# Patient Record
Sex: Male | Born: 1940 | Race: Black or African American | Hispanic: No | Marital: Married | Smoking: Current every day smoker
Health system: Southern US, Community
[De-identification: ages and names within clinical notes are randomized; demographics above are authoritative.]

## PROBLEM LIST (undated history)

## (undated) DIAGNOSIS — E785 Hyperlipidemia, unspecified: Secondary | ICD-10-CM

## (undated) DIAGNOSIS — R220 Localized swelling, mass and lump, head: Secondary | ICD-10-CM

## (undated) DIAGNOSIS — I10 Essential (primary) hypertension: Secondary | ICD-10-CM

## (undated) DIAGNOSIS — F209 Schizophrenia, unspecified: Secondary | ICD-10-CM

## (undated) HISTORY — DX: Localized swelling, mass and lump, head: R22.0

## (undated) HISTORY — DX: Schizophrenia, unspecified: F20.9

## (undated) HISTORY — DX: Essential (primary) hypertension: I10

## (undated) HISTORY — DX: Hyperlipidemia, unspecified: E78.5

---

## 2000-03-07 ENCOUNTER — Emergency Department (HOSPITAL_COMMUNITY): Admission: EM | Admit: 2000-03-07 | Discharge: 2000-03-07 | Payer: Self-pay | Admitting: Emergency Medicine

## 2000-04-23 ENCOUNTER — Ambulatory Visit (HOSPITAL_COMMUNITY): Admission: RE | Admit: 2000-04-23 | Discharge: 2000-04-23 | Payer: Self-pay | Admitting: Gastroenterology

## 2008-02-02 ENCOUNTER — Emergency Department (HOSPITAL_COMMUNITY): Admission: EM | Admit: 2008-02-02 | Discharge: 2008-02-03 | Payer: Self-pay | Admitting: Emergency Medicine

## 2008-02-04 ENCOUNTER — Inpatient Hospital Stay (HOSPITAL_COMMUNITY): Admission: EM | Admit: 2008-02-04 | Discharge: 2008-02-06 | Payer: Self-pay | Admitting: Emergency Medicine

## 2008-02-10 ENCOUNTER — Encounter (INDEPENDENT_AMBULATORY_CARE_PROVIDER_SITE_OTHER): Payer: Self-pay | Admitting: Urology

## 2008-02-11 ENCOUNTER — Inpatient Hospital Stay (HOSPITAL_COMMUNITY): Admission: RE | Admit: 2008-02-11 | Discharge: 2008-02-12 | Payer: Self-pay | Admitting: Urology

## 2009-02-17 IMAGING — CT CT ABDOMEN WO/W CM
2 of 12 series · 11 of 46 positions shown, 18 images · IV contrast (agent unspecified)
Comparison: None

 CT ABDOMEN

02/08/08 – DUPLICATE COPY for exam association in [facility] – No change from original report.
CLINICAL DATA: Hematuria

 CT ABDOMEN AND PELVIS WITHOUT AND WITH CONTRAST
TECHNIQUE: Multidetector CT imaging of the abdomen and pelvis was
 performed following the standard protocol before and following the
 bolus administration of intravenous contrast.
 Contrast: 125 ml omni 300

[Series 8: thins @ 2's · axial · 0.69mm/px · z∈[-460,-140]mm · 9 of 196 slices shown, 15 images]
[im 18/196  soft-tissue]
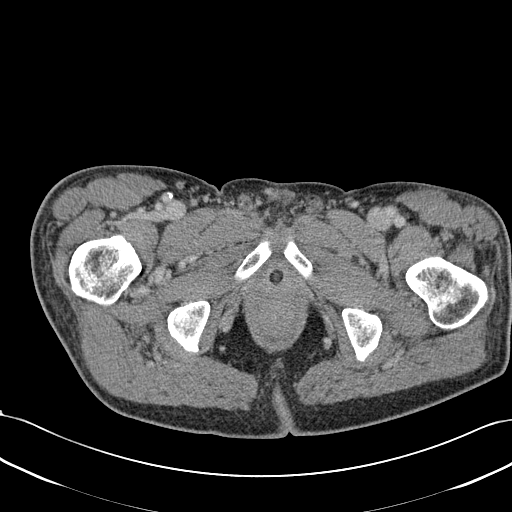
[im 18/196  bone]
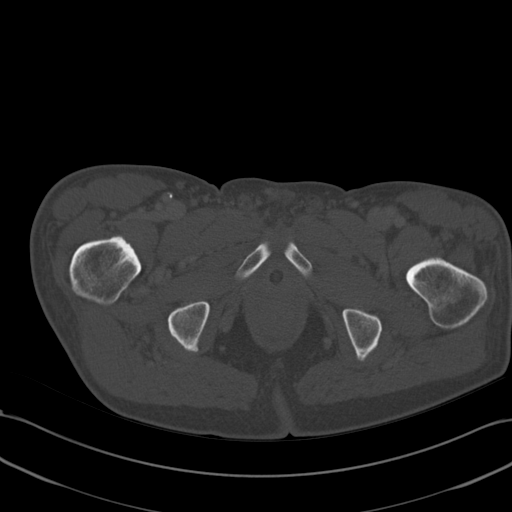
[im 36/196  soft-tissue]
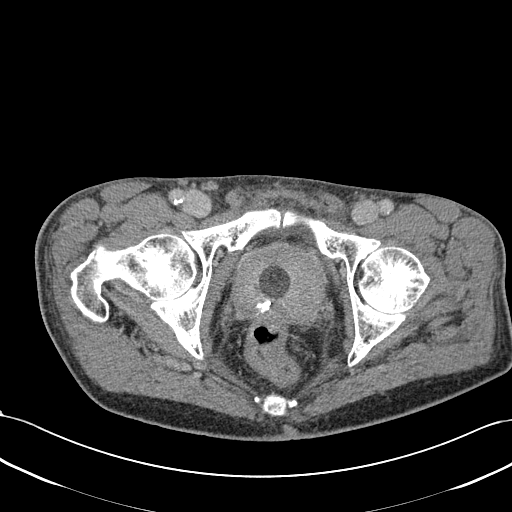
[im 54/196  soft-tissue]
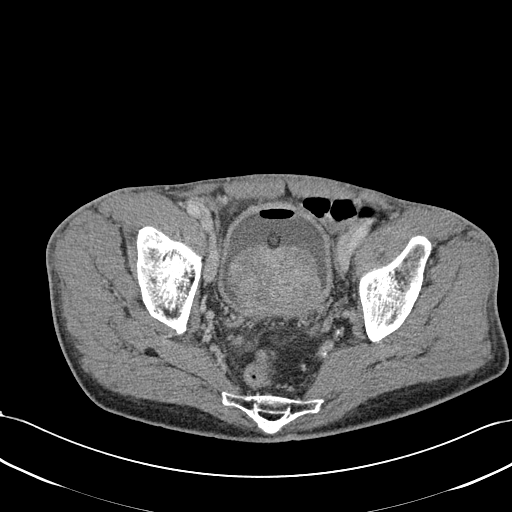
[im 71/196  soft-tissue]
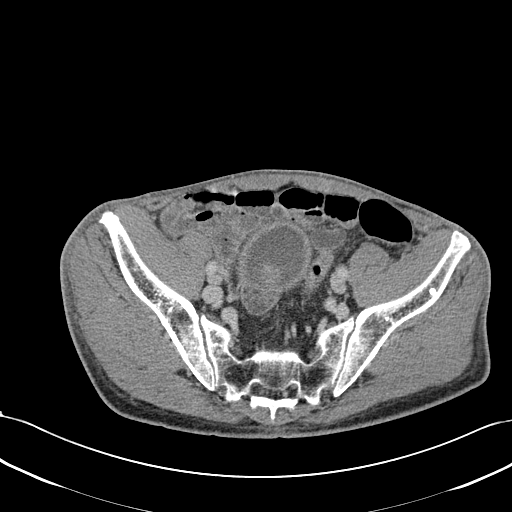
[im 107/196  soft-tissue]
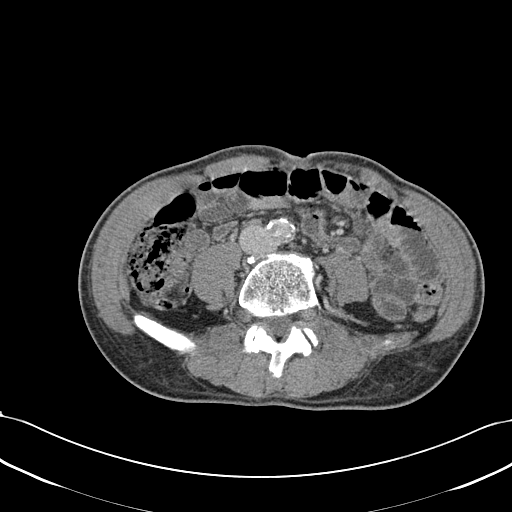
[im 125/196  soft-tissue]
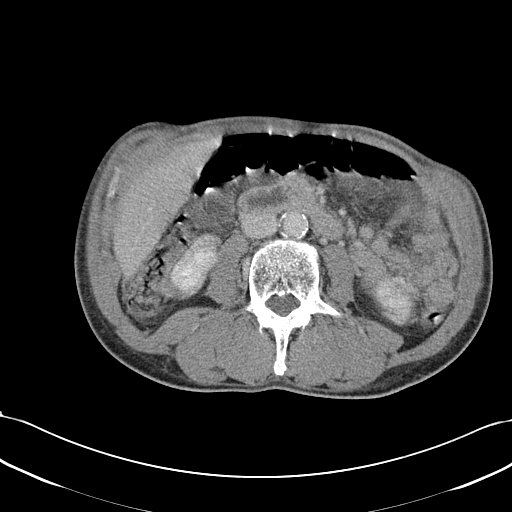
[im 125/196  lung]
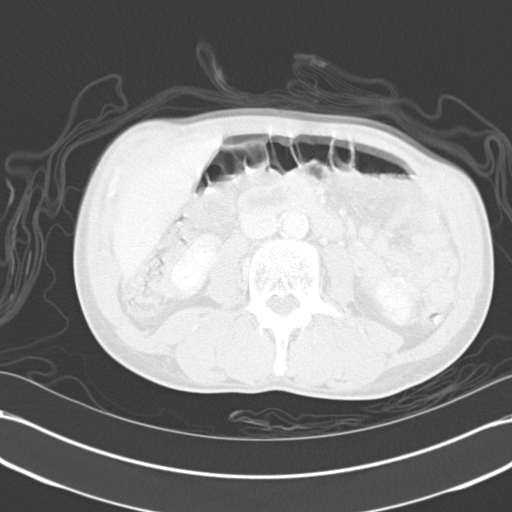
[im 142/196  soft-tissue]
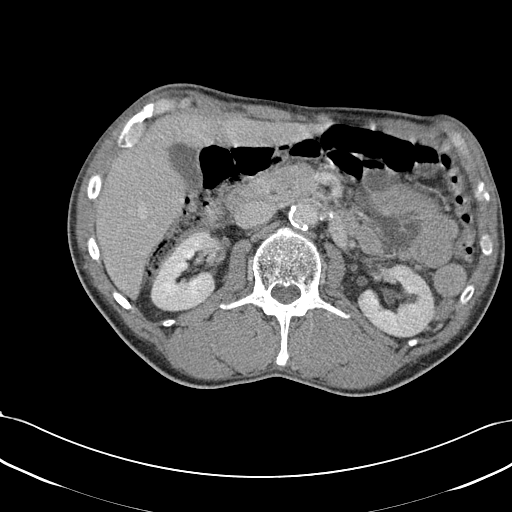
[im 142/196  lung]
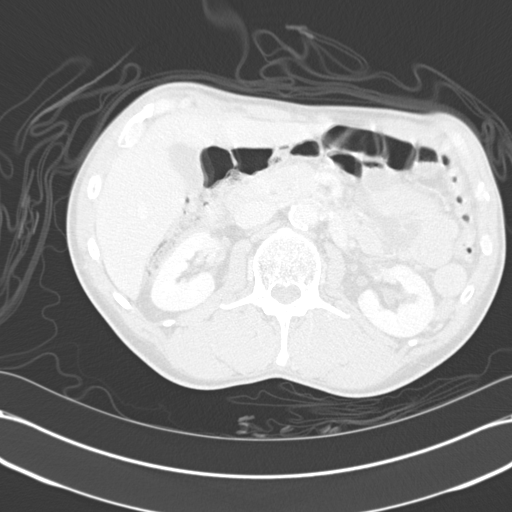
[im 160/196  soft-tissue]
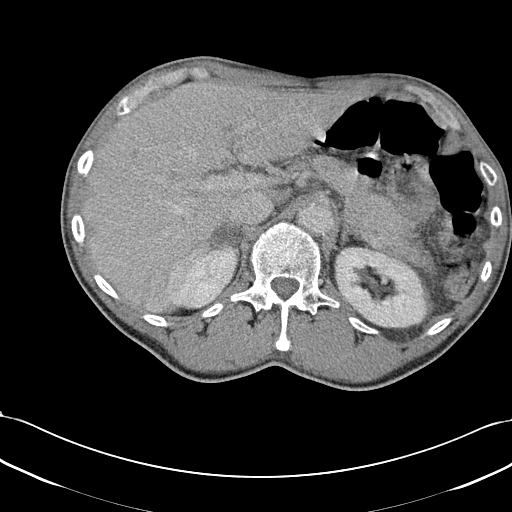
[im 160/196  lung]
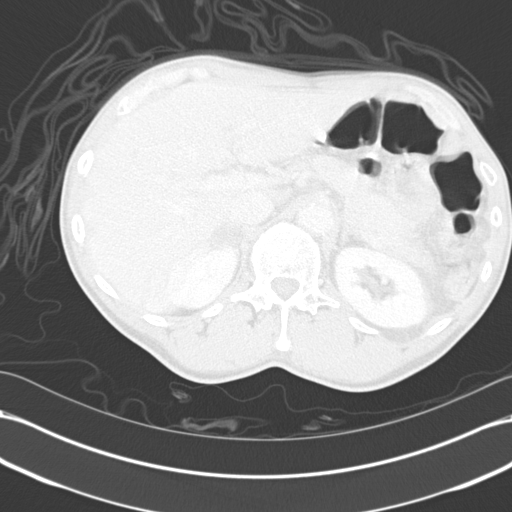
[im 178/196  soft-tissue]
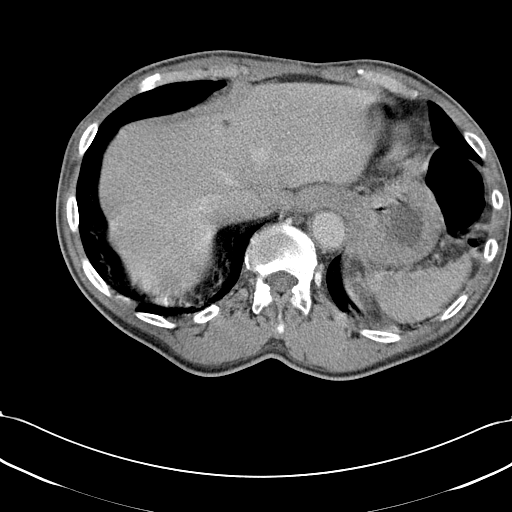
[im 178/196  lung]
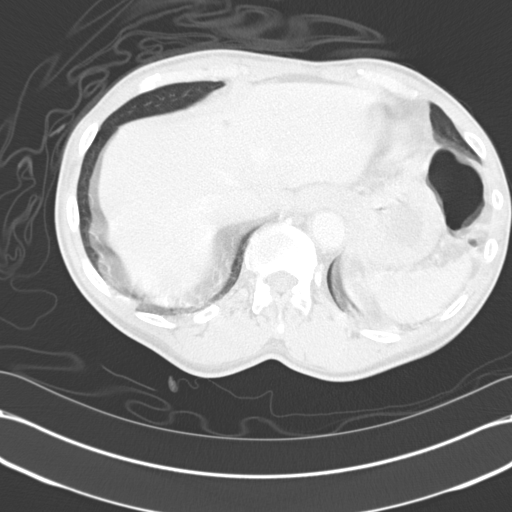
[im 178/196  bone]
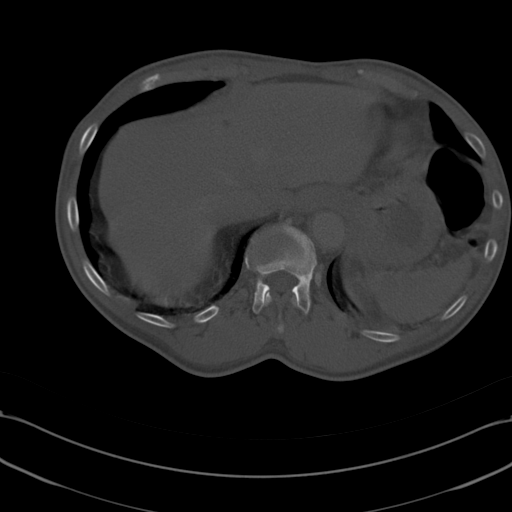

[Series 603: <mpr thick range> · coronal · 0.77mm/px · 2 of 84 slices shown, 3 images]
[im 28/84  soft-tissue]
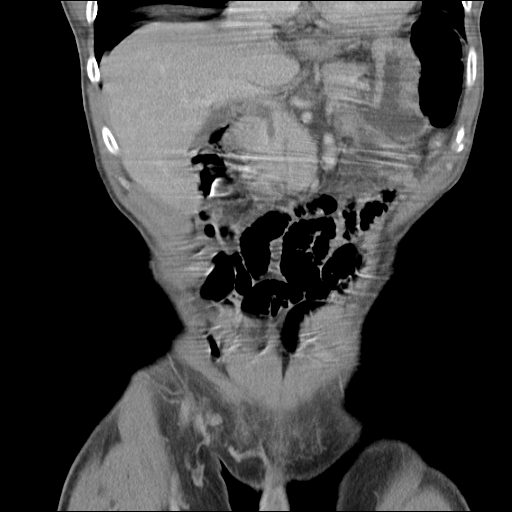
[im 28/84  bone]
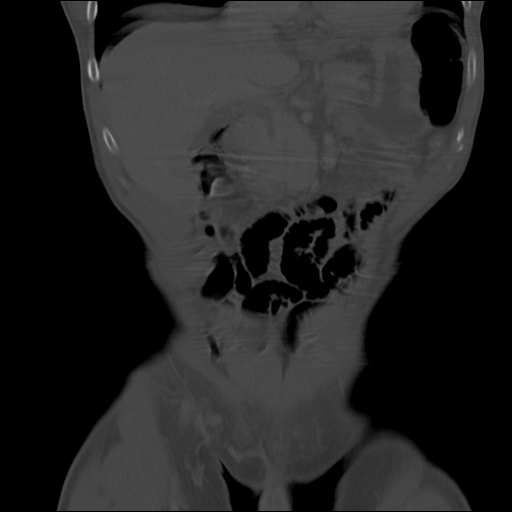
[im 56/84  soft-tissue]
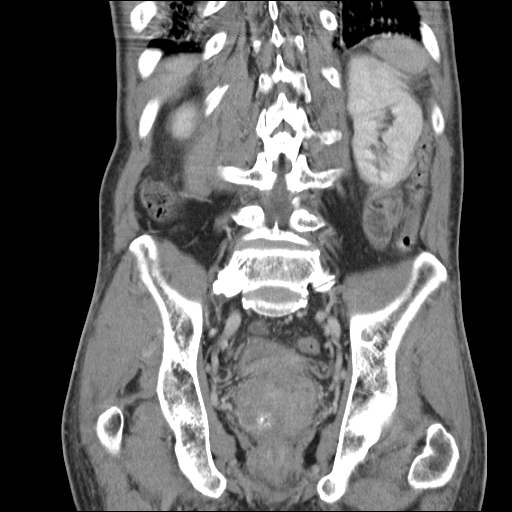

[11 of 46 positions shown; findings below may reference images not displayed]

FINDINGS: Dependent changes are noted at the lung bases. There is
 no pleural or pericardial fluid.

 The spleen is negative.

 There are multiple small hypodensities within both lobes of the
 liver the largest measures 5.3 mm, image 21. These are too small
 to characterize.

 There is no evidence for obstructive uropathy.

 There are multiple small (less than 10 mm) hypodensities in both
 kidneys which are too small to characterize but statistically
 represent simple cyst.

 Low density mass within the left adrenal gland measures 2.1 cm,
 image 26. This likely represents a benign adenoma.

 The left adrenal gland is negative.

 There is no enlarged retroperitoneal or small bowel mesenteric
 lymph nodes.

 No free fluid is identified within the upper abdomen.

 No upper abdominal mass noted.

 Review of the visualized osseous structures shows no suspicious
 lytic or sclerotic lesions.
IMPRESSION: 1. No acute upper abdominal CT findings. No mass or adenopathy.
 2. Low density foci within the liver and kidneys are too small to
 characterize but likely represent simple cyst.

 CT PELVIS
FINDINGS: There is a large mass within the posterior base of the
 urinary bladder. This measures 4.4 x 4.4 cm, image 46. The mass
 is very well circumscribed with smooth margins. This may be
 arising from the prostate gland. A primary bladder neoplasm
 however is not excluded.

 There is a Foley bladder catheter; the balloon is expanded within
 the prostatic portion of the urethra. The tip of the catheter is
 within the lumen of the urinary bladder.

 The prostate gland is markedly enlarged. Tumor involvement of the
 prostate gland cannot be excluded. There is a bilateral
 enlargement of the seminal vesicles. Extension into these areas is
 also not excluded.

 There is no enlarged lymph nodes within the pelvis.

 The pelvic bowel loops are unremarkable.

 There is no free fluid or abnormal fluid collections.

 Review of the visualized osseous structures shows diffuse
 osteopenia.

 No lytic or sclerotic lesions identified.
IMPRESSION: 1. Marked enlargement of the prostate gland and seminal vesicles.
 Prostate carcinoma is not excluded and correlation with biopsy
 advised.
 2. Large mass within the posterior base of the bladder may be
 originating from the prostate gland. Primary bladder neoplasm is
 also not excluded.
 3. Urinary bladder catheter is in place the catheter balloon is
 inflated within the prostate portion of the urethra.

## 2010-02-07 ENCOUNTER — Emergency Department (HOSPITAL_COMMUNITY): Admission: EM | Admit: 2010-02-07 | Discharge: 2010-02-07 | Payer: Self-pay | Admitting: Family Medicine

## 2011-01-14 NOTE — H&P (Signed)
NAME:  William Madden, William Madden NO.:  [id number]   MEDICAL RECORD NO.:  [id number]          PATIENT TYPE:  OBV   LOCATION:  1433                         FACILITY:  [facility]   PHYSICIAN:  Martina Sinner, MD DATE OF BIRTH:  [birth year]   DATE OF ADMISSION:  02/03/2008  DATE OF DISCHARGE:                              HISTORY & PHYSICAL   PRIMARY DIAGNOSIS:  Clot retention and hematuria.   SECONDARY DIAGNOSIS:  1. Acute blood loss anemia.  2. 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     William Madden is a 70 year old gentleman who was in the emergency room  twice with clot retention.  His symptoms were a little bit vague, and he  may have been having a little bit of dysuria and small volume frequency  48 hours prior to this.  He had suprapubic pain from clot retention.  This was getting worse over 6 hours.   When I came to the emergency room he was tender and I have written an  admission note.  There is no question that he is in clot retention.  He  was irrigated to clear and a 26-French three-way catheter with  continuous bladder radiation was hooked up.  His hemoglobin was 12.2  when he was admitted.  He had positive nitrates in his urine.  His white  blood count was 11.5.  His electrolytes were normal.  Serum creatinine  was 0.99.   PAST HISTORY:  Pneumonia.   MEDICATIONS:  He had taken one Cipro tablet.  Otherwise, he takes no  medication.   ALLERGIES:  ASPIRIN.   SOCIAL HISTORY:  Past smoking history.   FAMILY HISTORY:  No prostate or genitourinary malignancies.   REVIEW OF SYSTEMS:  The rest of the review of systems is negative.   PHYSICAL EXAMINATION:  Temperature 98.9, blood pressure 124/81, pulse  76.  CARDIOVASCULAR:  Pulse regular.  Skin is warm.  RESPIRATORY:  Breaths quiet.  Positive ketones on breath.  ABDOMEN:  Palpable bladder.  Tender to touch.  GU exam, normal genitalia.  He had a 60 g, hard to reach, benign-feeling  prostate.  SKIN:   Benign facial mass on right side.  LYMPHATICS:  No inguinal nodes.  MS:  Normal leg and arm motor strength.  NEUROLOGIC:  Normal sensation to touch.   LABS:  Positive nitrates and red blood cells in the urine.  Hemoglobin,  0.2, creatinine 0.99.   William Madden was admitted with CVI and for observation.  He will have a CT  scan and eventually a cystoscopy.  I placed him on ciprofloxacin.  An IV  was also placed.           ______________________________  Martina Sinner, MD  Electronically Signed     SAM/MEDQ  D:  02/04/2008  T:  02/04/2008  Job:  [id number]

## 2011-01-14 NOTE — Discharge Summary (Signed)
NAMEFINN, AMOS NO.:  [id number]   MEDICAL RECORD NO.:  [id number]          PATIENT TYPE:  INP   LOCATION:  1433                         FACILITY:  [facility]   PHYSICIAN:  Martina Sinner, MD DATE OF BIRTH:  [birth year]   DATE OF ADMISSION:  02/03/2008  DATE OF DISCHARGE:  02/06/2008                               DISCHARGE SUMMARY   ADMISSION DIAGNOSES:  1. Clot retention.  2. Acute blood loss anemia.   DISCHARGE DIAGNOSES:  1. Clot retention.  2. Acute blood loss anemia.  3. Bladder tumor.     Mr. Nasire Reali was admitted the hospital with clot retention.  I  irrigated him in the emergency room to clear and set up a continuous  bladder irrigation.  His hemoglobin was 12.1 the day before when he was  seen in the emergency room for the same problem.  He came in with a  Foley catheter not draining well.   While in hospital, his vitals were stable but he dropped his hemoglobin  to 8.1.  He received 2 units of blood and his hemoglobin stabilized.  Three or four hours after I admitted him, he had another episode of  clots that was irrigated to clear.  His urine remained clear for the  rest of the 48-hour admission.   Mr. Daigle hemoglobin stabilized, his vitals were stable.  His belly  was soft and nontender and his catheter was draining well.  He had no  signs of infection, though his urine culture grew E. coli  and he was  treated with ciprofloxacin.  The E. coli was pan sensitive.   His serum creatinine was with was 1.3 and he underwent a CT scan of the  abdomen and pelvis as part of his workup.  His kidneys looked normal.  He had a large mass in the bladder, almost baseball shape, which was  probably bladder cancer.  I did a digital rectal examination.  He had a  large prostate that was difficult to feel.  I did not feel any nodules.   On the day of discharge, Mr. Lundeen had trouble to urinate.  The  catheter was reinserted.  He was sent home  with a catheter.   Mr. Kitagawa was seen by my partner Dr. Heloise Purpura on the eve of his  discharge and Dr. Laverle Patter counseled him and set him up for a cystoscopy  and transurethral section of probable bladder tumor as an outpatient.   Mr. Liew was discharged home with a Foley catheter, with antibiotics  and be following up with Dr. Laverle Patter and as mentioned.   DISCHARGED:  He was discharged home with ciprofloxacin and no other  medication.           ______________________________  Martina Sinner, MD  Electronically Signed     SAM/MEDQ  D:  02/08/2008  T:  02/08/2008  Job:  [id number]

## 2011-01-14 NOTE — Op Note (Signed)
NAMEPELHAM, William Madden NO.:  [id number]   MEDICAL RECORD NO.:  [id number]          PATIENT TYPE:  OIB   LOCATION:  1444                         FACILITY:  [facility]   PHYSICIAN:  Heloise Purpura, MD      DATE OF BIRTH:  09/14/1940   DATE OF PROCEDURE:  02/10/2008  DATE OF DISCHARGE:                               OPERATIVE REPORT   PREOPERATIVE DIAGNOSES:  1. Bladder mass.  2. Hematuria  3. Urinary retention.   POSTOPERATIVE DIAGNOSIS:  1. Benign prostatic hypertrophy.  2. Hematuria.  3. Urinary retention.   PROCEDURES:  1. Cystoscopy.  2. Transurethral resection of the prostate.   SURGEON:  Dr. Heloise Purpura.   ANESTHESIA:  General.   COMPLICATIONS:  None.   SPECIMENS:  Prostate chips.   DISPOSITION:  Specimens to pathology.   INDICATIONS:  Mr. Riling is a 70 year old gentleman who was recently  admitted to the hospital by Dr. Lorin Picket McDiarmid due to gross hematuria  and clot urinary retention.  He was also found to have a urinary tract  infection and underwent treatment with ciprofloxacin.  His hematuria  subsequently cleared, and he was able to be discharged home.  However,  he did fail a voiding trial and required being discharged with his Foley  catheter.  During his hospitalization, he did undergo a CT scan for  further evaluation of his hematuria, which demonstrated a large  circumscribed posterior bladder mass.  It was discussed with him at that  time about proceeding with further cystoscopic evaluation.  Based on the  patient's hematuria and urinary retention, it was felt that even in the  event that this would represent significant hyperplasia of the prostate  that it would be worthwhile to proceed with a definitive treatment.  The  potential risks, complications, and alternative options were discussed  with the patient in detail and informed consent was obtained.   DESCRIPTION OF PROCEDURE:  The patient was taken to the operating room  and a  general anesthetic was administered.  He was given preoperative  antibiotics, placed in the dorsal lithotomy position, and prepped and  draped in the usual sterile fashion.  Next, a preoperative time-out was  performed.  Cystourethroscopy was then performed with the 30 degree  lens, which revealed a normal anterior urethra and a very large  prostate.  While his lateral lobes were certainly enlarged, he had an  immense median lobe.  His ureteral orifices were not readily apparent at  first and indigo-carmine was administered, which did allow  identification of the ureteral orifices.  His median lobe was noted to  be bleeding in multiple areas and extended significantly into the  bladder toward the posterior wall.  There was no evidence for any  bladder tumors or bladder stones.  The cystoscope was then removed and  replaced with a 28-French resectoscope sheath.  Using loupe cautery  resection, the patient's median lobe was then carefully resected in a  systematic fashion with care to provide hemostasis along the way.  The  ureteral orifices were identified throughout the procedure to ensure  their viability.  Once the median lobe had been taken down a significant  amount which was felt to control his hematuria and also to hopefully  improve his voiding, hemostasis was achieved with cautery.  The prostate  chips were removed from the bladder with the Ellik evacuator and a 24-  [place] three-way Silastic hematuria catheter was placed, and the patient  was placed on continuous bladder irrigation.  He was subsequently  transferred to the recovery unit in satisfactory condition after being  awakened from anesthesia.  He tolerated the procedure well and without  complications.      Heloise Purpura, MD  Electronically Signed     LB/MEDQ  D:  02/10/2008  T:  02/10/2008  Job:  [id number]

## 2011-01-17 NOTE — Procedures (Signed)
[facility]  Patient:    HENCE, William Madden                       MRN: [id number] Proc. Date: 04/23/00 Adm. Date:  [id number] Disc. Date: [id number] Attending:  Nelda Marseille CC:         Aura Dials, M.D.   Procedure Report  PROCEDURE:  Esophagogastroduodenoscopy with biopsy.  INDICATIONS:  Abnormal upper GI.  Consent was signed after risks, benefits, methods and options were thoroughly discussed with both the patient and his daughter.  MEDICINES USED:  Demerol 50, Versed 5.  PROCEDURE IN DETAIL:  The video endoscope was inserted by direct vision.  The esophagus was essentially normal.  There were some minimal reflux changes. 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  The pylorus was patent.  Scope passed into the duodenal bulb, which had some minimal bulbitis and around the second portion of the duodenum.  The scope was withdrawn back to the bulb and a good look there ruled out ulcers. The scope was withdrawn back to the stomach and retroflexed.  Just above the angularis on the distal lesser curve a large ulcer was seen and multiple biopsies were obtained and put in the first container.  The cardia and fundus were normal.  The proximal stomach was evaluated on retroflex and then straight visualization.  No additional findings were seen.  The scope was then advanced to the antrum and multiple biopsies in the significant antritis were obtained.  Air was suctioned, scope removed.  Again, a good look at the esophagus showed some minimal reflux changes but no other abnormalities.  The scope was removed.  The patient tolerated the procedure well.  There was no evidence of any complications.  ENDOSCOPIC DIAGNOSES: 1.  Large lesser curve distal ulcer status post biopsy put in container number     one. 2.  Significant  antritis status post biopsy, put in container number two. 3.  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 4.  Otherwise normal EGD except for minimal reflux changes, bulbitis.  PLAN:  Continue Nexium, await pathology.  Follow up in six weeks to recheck symptoms, and if biopsies are okay, reschedule EGD to document healing.  No aspirin or nonsteroidals long-term, Tylenol only.  We will write all that on his discharge dictation. DD:  04/23/00 TD:  04/24/00 Job: [id number]

## 2011-01-17 NOTE — Discharge Summary (Signed)
NAMETHOMSON, HERBERS NO.:  [id number]   MEDICAL RECORD NO.:  [id number]          PATIENT TYPE:  INP   LOCATION:  1444                         FACILITY:  [facility]   PHYSICIAN:  Heloise Purpura, MD      DATE OF BIRTH:  [birth year]   DATE OF ADMISSION:  02/10/2008  DATE OF DISCHARGE:  02/12/2008                               DISCHARGE SUMMARY   ADMISSION DIAGNOSES:  1. Hematuria.  2. Bladder mass.  3. Urinary retention.   DISCHARGE DIAGNOSES:  1. Hematuria.  2. Urinary retention.  3. Benign prostatic hyperplasia.   HISTORY AND PHYSICAL:  For full details, please see admission history  and physical.  Briefly, Mr. Blaustein is a 70 year old gentleman who  presented to the hospital last week with gross hematuria and clot  urinary retention.  He underwent evaluation, including a CT scan which  demonstrated a large bladder mass.  He required blood transfusion and  bladder irrigation to manage his hematuria.  He subsequently was able to  be discharged home.  He was then admitted to the hospital on February 10, 2008, to undergo cystoscopy for further evaluation of his bladder mass  and possible transurethral resection.   HOSPITAL COURSE:  On February 10, 2008, the patient was taken to the  operating room and underwent cystoscopy.  He was noted not to have a  urothelial tumor but rather an extremely large median lobe of the  prostate extending far into his bladder.  He underwent a transurethral  resection of his median lobe to help get him out of urinary retention  and to help control his gross hematuria.  He was subsequently admitted  to the hospital for postoperative care and was maintained on continuous  bladder irrigation.  His irrigation was able to be discontinued on  postoperative day #1, and his catheter was left indwelling.  His  hemoglobin remained stable throughout his hospital course with his  hemoglobin being 8.4 on postoperative day #1.  This was not  significantly  changed from his discharge hemoglobin after his prior  hospital admission.  He was able to void following removal of his  catheter and was able to be discharged home.   DISPOSITION:  Home.   DISCHARGE MEDICATIONS:  He was instructed to use Vicodin as needed for  pain, told to use Colace as a stool softener, and given a prescription  to take Cipro for antibiotic prophylaxis.   DISCHARGE INSTRUCTIONS:  He was instructed to be ambulatory but  specifically told to refrain from any heavy lifting, strenuous activity,  or driving.   FOLLOW UP:  He will follow up in approximately 2-3 weeks with a postvoid  residual urine.      Heloise Purpura, MD  Electronically Signed     LB/MEDQ  D:  02/13/2008  T:  02/14/2008  Job:  [id number]

## 2011-05-29 LAB — CBC
HCT: 23.3 — ABNORMAL LOW
HCT: 24.6 — ABNORMAL LOW
HCT: 27.2 — ABNORMAL LOW
HCT: 36.7 — ABNORMAL LOW
Hemoglobin: 8.4 — ABNORMAL LOW
Hemoglobin: 9 — ABNORMAL LOW
Hemoglobin: 9.3 — ABNORMAL LOW
Hemoglobin: 9.5 — ABNORMAL LOW
MCHC: 33.7
MCHC: 34.1
MCHC: 34.1
MCHC: 34.6
MCV: 94.2
Platelets: 109 — ABNORMAL LOW
Platelets: 211
Platelets: 86 — ABNORMAL LOW
Platelets: 87 — ABNORMAL LOW
Platelets: 89 — ABNORMAL LOW
RBC: 2.56 — ABNORMAL LOW
RBC: 2.76 — ABNORMAL LOW
RDW: 14.6
RDW: 16.8 — ABNORMAL HIGH
RDW: 16.9 — ABNORMAL HIGH
RDW: 17.4 — ABNORMAL HIGH
WBC: 11 — ABNORMAL HIGH
WBC: 8
WBC: 8.9

## 2011-05-29 LAB — BASIC METABOLIC PANEL
BUN: 10
BUN: 16
CO2: 27
CO2: 29
Calcium: 8.1 — ABNORMAL LOW
Calcium: 8.3 — ABNORMAL LOW
Calcium: 8.4
Calcium: 8.5
Chloride: 110
Creatinine, Ser: 1.02
GFR calc Af Amer: >60
GFR calc Af Amer: >60
GFR calc non Af Amer: >60
GFR calc non Af Amer: >60
GFR calc non Af Amer: >60
GFR calc non Af Amer: >60
Glucose, Bld: 100 — ABNORMAL HIGH
Glucose, Bld: 114 — ABNORMAL HIGH
Glucose, Bld: 121 — ABNORMAL HIGH
Glucose, Bld: 99
Potassium: 4.1
Potassium: 4.2
Potassium: 4.3
Sodium: 137
Sodium: 139
Sodium: 139
Sodium: 142

## 2011-05-29 LAB — POCT I-STAT 4, (NA,K, GLUC, HGB,HCT)
Glucose, Bld: 113 — ABNORMAL HIGH
HCT: 28 — ABNORMAL LOW
Operator id: 101021
Potassium: 4.1
Sodium: 136

## 2011-05-29 LAB — ABO/RH
ABO/RH(D): B POS
ABO/RH(D): B POS

## 2011-05-29 LAB — CROSSMATCH
ABO/RH(D): B POS
Antibody Screen: NEGATIVE

## 2011-05-29 LAB — URINALYSIS, ROUTINE W REFLEX MICROSCOPIC
Glucose, UA: NEGATIVE
pH: 7

## 2011-05-29 LAB — URINE CULTURE

## 2011-05-29 LAB — DIFFERENTIAL
Basophils Absolute: 0
Basophils Relative: 0
Eosinophils Absolute: 0
Eosinophils Relative: 0
Lymphocytes Relative: 5 — ABNORMAL LOW

## 2011-05-29 LAB — HEMOGLOBIN AND HEMATOCRIT, BLOOD: HCT: 28.1 — ABNORMAL LOW

## 2012-10-08 LAB — HEPATIC FUNCTION PANEL
ALK PHOS: 42 U/L (ref 25–125)
ALT: 9 U/L — AB (ref 10–40)
AST: 16 U/L (ref 14–40)
Bilirubin, Total: 0.6 mg/dL

## 2012-10-08 LAB — LIPID PANEL
CHOLESTEROL: 175 mg/dL (ref 0–200)
HDL: 38 mg/dL (ref 35–70)
LDL Cholesterol: 125 mg/dL
LDl/HDL Ratio: 4.6
Triglycerides: 61 mg/dL (ref 40–160)

## 2012-10-08 LAB — BASIC METABOLIC PANEL
BUN: 21 mg/dL (ref 4–21)
CREATININE: 1.1 mg/dL (ref 0.6–1.3)
GLUCOSE: 92 mg/dL
POTASSIUM: 4.3 mmol/L (ref 3.4–5.3)
Sodium: 142 mmol/L (ref 137–147)

## 2012-10-25 ENCOUNTER — Other Ambulatory Visit: Payer: Self-pay | Admitting: Family Medicine

## 2012-10-25 DIAGNOSIS — R0989 Other specified symptoms and signs involving the circulatory and respiratory systems: Secondary | ICD-10-CM

## 2012-10-26 ENCOUNTER — Ambulatory Visit
Admission: RE | Admit: 2012-10-26 | Discharge: 2012-10-26 | Disposition: A | Discharge status: Home / Self Care | Payer: Medicare Other | Source: Ambulatory Visit | Attending: Family Medicine | Admitting: Family Medicine

## 2012-10-26 DIAGNOSIS — R0989 Other specified symptoms and signs involving the circulatory and respiratory systems: Secondary | ICD-10-CM

## 2013-10-17 ENCOUNTER — Encounter: Payer: Self-pay | Admitting: Family Medicine

## 2013-10-17 ENCOUNTER — Telehealth: Payer: Self-pay | Admitting: Family Medicine

## 2013-10-17 DIAGNOSIS — E785 Hyperlipidemia, unspecified: Secondary | ICD-10-CM | POA: Insufficient documentation

## 2013-10-17 DIAGNOSIS — I10 Essential (primary) hypertension: Secondary | ICD-10-CM | POA: Insufficient documentation

## 2013-10-17 MED ORDER — PRAVASTATIN SODIUM 80 MG PO TABS: 80.0000 mg | ORAL_TABLET | Freq: Every day | ORAL | Status: DC

## 2013-10-17 MED ORDER — LISINOPRIL-HYDROCHLOROTHIAZIDE 20-12.5 MG PO TABS: 1.0000 | ORAL_TABLET | Freq: Every day | ORAL | Status: DC

## 2013-10-17 NOTE — Telephone Encounter (Signed)
Medication refill for one time only.  Patient needs to be seen.  Letter sent for patient to call and schedule 

## 2013-11-04 ENCOUNTER — Other Ambulatory Visit: Payer: Medicare Other

## 2013-11-04 DIAGNOSIS — Z125 Encounter for screening for malignant neoplasm of prostate: Secondary | ICD-10-CM

## 2013-11-04 DIAGNOSIS — I10 Essential (primary) hypertension: Secondary | ICD-10-CM

## 2013-11-04 DIAGNOSIS — Z79899 Other long term (current) drug therapy: Secondary | ICD-10-CM

## 2013-11-04 DIAGNOSIS — E782 Mixed hyperlipidemia: Secondary | ICD-10-CM

## 2013-11-04 LAB — CBC WITH DIFFERENTIAL/PLATELET
BASOS ABS: 0 10*3/uL (ref 0.0–0.1)
BASOS PCT: 0 % (ref 0–1)
EOS ABS: 0.1 10*3/uL (ref 0.0–0.7)
EOS PCT: 2 % (ref 0–5)
HCT: 37 % — ABNORMAL LOW (ref 39.0–52.0)
Hemoglobin: 12.2 g/dL — ABNORMAL LOW (ref 13.0–17.0)
LYMPHS PCT: 43 % (ref 12–46)
Lymphs Abs: 2.5 10*3/uL (ref 0.7–4.0)
MCH: 32.4 pg (ref 26.0–34.0)
MCHC: 33 g/dL (ref 30.0–36.0)
MCV: 98.4 fL (ref 78.0–100.0)
Monocytes Absolute: 0.5 10*3/uL (ref 0.1–1.0)
Monocytes Relative: 8 % (ref 3–12)
Neutro Abs: 2.7 10*3/uL (ref 1.7–7.7)
Neutrophils Relative %: 47 % (ref 43–77)
PLATELETS: 147 10*3/uL — AB (ref 150–400)
RBC: 3.76 MIL/uL — ABNORMAL LOW (ref 4.22–5.81)
RDW: 14 % (ref 11.5–15.5)
WBC: 5.8 10*3/uL (ref 4.0–10.5)

## 2013-11-04 LAB — COMPREHENSIVE METABOLIC PANEL
ALBUMIN: 3.9 g/dL (ref 3.5–5.2)
ALT: 9 U/L (ref 0–53)
AST: 13 U/L (ref 0–37)
Alkaline Phosphatase: 42 U/L (ref 39–117)
BUN: 19 mg/dL (ref 6–23)
CALCIUM: 9 mg/dL (ref 8.4–10.5)
CHLORIDE: 106 meq/L (ref 96–112)
CO2: 23 meq/L (ref 19–32)
Creat: 1.22 mg/dL (ref 0.50–1.35)
Glucose, Bld: 93 mg/dL (ref 70–99)
POTASSIUM: 4.4 meq/L (ref 3.5–5.3)
SODIUM: 141 meq/L (ref 135–145)
TOTAL PROTEIN: 6.4 g/dL (ref 6.0–8.3)
Total Bilirubin: 0.6 mg/dL (ref 0.2–1.2)

## 2013-11-04 LAB — PSA, MEDICARE: PSA: 3.51 ng/mL (ref <=4.00)

## 2013-11-04 LAB — LIPID PANEL
Cholesterol: 184 mg/dL (ref 0–200)
HDL: 35 mg/dL — AB (ref >39)
LDL Cholesterol: 136 mg/dL — ABNORMAL HIGH (ref 0–99)
Total CHOL/HDL Ratio: 5.3 Ratio
Triglycerides: 64 mg/dL (ref <150)
VLDL: 13 mg/dL (ref 0–40)

## 2013-11-07 ENCOUNTER — Other Ambulatory Visit: Payer: Self-pay | Admitting: Family Medicine

## 2013-11-07 DIAGNOSIS — Z1211 Encounter for screening for malignant neoplasm of colon: Secondary | ICD-10-CM

## 2013-11-08 ENCOUNTER — Ambulatory Visit (INDEPENDENT_AMBULATORY_CARE_PROVIDER_SITE_OTHER): Payer: Medicare Other | Admitting: Family Medicine

## 2013-11-08 ENCOUNTER — Encounter: Payer: Self-pay | Admitting: Family Medicine

## 2013-11-08 VITALS — BP 140/80 | HR 76 | Temp 97.6°F | Resp 24 | Ht 71.0 in | Wt 137.0 lb

## 2013-11-08 DIAGNOSIS — F200 Paranoid schizophrenia: Secondary | ICD-10-CM

## 2013-11-08 DIAGNOSIS — R220 Localized swelling, mass and lump, head: Secondary | ICD-10-CM | POA: Insufficient documentation

## 2013-11-08 DIAGNOSIS — Z0000 Encounter for general adult medical examination without abnormal findings: Secondary | ICD-10-CM

## 2013-11-08 DIAGNOSIS — F209 Schizophrenia, unspecified: Secondary | ICD-10-CM | POA: Insufficient documentation

## 2013-11-08 MED ORDER — LISINOPRIL-HYDROCHLOROTHIAZIDE 20-12.5 MG PO TABS: 1.0000 | ORAL_TABLET | Freq: Every day | ORAL | Status: DC

## 2013-11-08 MED ORDER — QUETIAPINE FUMARATE 25 MG PO TABS: 25.0000 mg | ORAL_TABLET | Freq: Two times a day (BID) | ORAL | Status: DC

## 2013-11-08 MED ORDER — ATORVASTATIN CALCIUM 80 MG PO TABS: 80.0000 mg | ORAL_TABLET | Freq: Every day | ORAL | Status: DC

## 2013-11-08 NOTE — Progress Notes (Signed)
 Subjective:    Patient ID: William Madden, male    DOB: 10/04/1940, 72 y.o.   MRN: [id number]  HPI Patient is here today with his wife for complete physical exam. He has no desire to be here. He has a history of undefined no illness. I believe the patient has schizophrenia with paranoid delusions. He is previously been admitted to [facility] and was treated with antipsychotics over 40 years ago. He has not had any psychiatrist since and he uses all psychiatric consultation. He refuses a colonoscopy. He refuses a prostate exam. His most recent labwork as listed below: Lab on 11/04/2013  Component Date Value Ref Range Status  . WBC 11/04/2013 5.8  4.0 - 10.5 K/uL Final  . RBC 11/04/2013 3.76* 4.22 - 5.81 MIL/uL Final  . Hemoglobin 11/04/2013 12.2* 13.0 - 17.0 g/dL Final  . HCT 11/04/2013 37.0* 39.0 - 52.0 % Final  . MCV 11/04/2013 98.4  78.0 - 100.0 fL Final  . MCH 11/04/2013 32.4  26.0 - 34.0 pg Final  . MCHC 11/04/2013 33.0  30.0 - 36.0 g/dL Final  . RDW 11/04/2013 14.0  11.5 - 15.5 % Final  . Platelets 11/04/2013 147* 150 - 400 K/uL Final  . Neutrophils Relative % 11/04/2013 47  43 - 77 % Final  . Neutro Abs 11/04/2013 2.7  1.7 - 7.7 K/uL Final  . Lymphocytes Relative 11/04/2013 43  12 - 46 % Final  . Lymphs Abs 11/04/2013 2.5  0.7 - 4.0 K/uL Final  . Monocytes Relative 11/04/2013 8  3 - 12 % Final  . Monocytes Absolute 11/04/2013 0.5  0.1 - 1.0 K/uL Final  . Eosinophils Relative 11/04/2013 2  0 - 5 % Final  . Eosinophils Absolute 11/04/2013 0.1  0.0 - 0.7 K/uL Final  . Basophils Relative 11/04/2013 0  0 - 1 % Final  . Basophils Absolute 11/04/2013 0.0  0.0 - 0.1 K/uL Final  . Smear Review 11/04/2013 Criteria for review not met   Final  . Cholesterol 11/04/2013 184  0 - 200 mg/dL Final   Comment: ATP III Classification:                                < 200        mg/dL        Desirable                               200 - 239     mg/dL        Borderline High   >= 240        mg/dL        High                             . Triglycerides 11/04/2013 64  <150 mg/dL Final  . HDL 11/04/2013 35* >39 mg/dL Final  . Total CHOL/HDL Ratio 11/04/2013 5.3   Final  . VLDL 11/04/2013 13  0 - 40 mg/dL Final  . LDL Cholesterol 11/04/2013 136* 0 - 99 mg/dL Final   Comment:                            Total Cholesterol/HDL Ratio:CHD Risk                                                   Coronary Heart Disease Risk Table                                                                 Men       Women                                   1/2 Average Risk              3.4        3.3                                       Average Risk              5.0        4.4                                    2X Average Risk              9.6        7.1                                    3X Average Risk             23.4       11.0                          Use the calculated Patient Ratio above and the CHD Risk table                           to determine the patient's CHD Risk.                          ATP III Classification (LDL):                                < 100        mg/dL         Optimal                               100 - 129     mg/dL         Near or Above Optimal                               130 - 159     mg/dL         Borderline High                               160 - 189     mg/dL  High                                > 190        mg/dL         Very High                             . PSA 11/04/2013 3.51  <=4.00 ng/mL Final   Comment: Test Methodology: ECLIA PSA (Electrochemiluminescence Immunoassay)                                                     For PSA values from 2.5-4.0, particularly in younger men <60 years                          old, the AUA and NCCN suggest testing for % Free PSA (3515) and                          evaluation of the rate of increase in PSA (PSA velocity).  . Sodium 11/04/2013 141  135 - 145 mEq/L Final  . Potassium 11/04/2013 4.4  3.5  - 5.3 mEq/L Final  . Chloride 11/04/2013 106  96 - 112 mEq/L Final  . CO2 11/04/2013 23  19 - 32 mEq/L Final  . Glucose, Bld 11/04/2013 93  70 - 99 mg/dL Final  . BUN 11/04/2013 19  6 - 23 mg/dL Final  . Creat 11/04/2013 1.22  0.50 - 1.35 mg/dL Final  . Total Bilirubin 11/04/2013 0.6  0.2 - 1.2 mg/dL Final  . Alkaline Phosphatase 11/04/2013 42  39 - 117 U/L Final  . AST 11/04/2013 13  0 - 37 U/L Final  . ALT 11/04/2013 9  0 - 53 U/L Final  . Total Protein 11/04/2013 6.4  6.0 - 8.3 g/dL Final  . Albumin 11/04/2013 3.9  3.5 - 5.2 g/dL Final  . Calcium 11/04/2013 9.0  8.4 - 10.5 mg/dL Final  Abstract on 10/17/2013  Component Date Value Ref Range Status  . Glucose 10/08/2012 92   Final  . BUN 10/08/2012 21  4 - 21 mg/dL Final  . Creatinine 10/08/2012 1.1  0.6 - 1.3 mg/dL Final  . Potassium 10/08/2012 4.3  3.4 - 5.3 mmol/L Final  . Sodium 10/08/2012 142  137 - 147 mmol/L Final  . LDl/HDL Ratio 10/08/2012 4.6   Final  . Triglycerides 10/08/2012 61  40 - 160 mg/dL Final  . Cholesterol 10/08/2012 175  0 - 200 mg/dL Final  . HDL 10/08/2012 38  35 - 70 mg/dL Final  . LDL Cholesterol 10/08/2012 125   Final  . Alkaline Phosphatase 10/08/2012 42  25 - 125 U/L Final  . ALT 10/08/2012 9* 10 - 40 U/L Final  . AST 10/08/2012 16  14 - 40 U/L Final  . Bilirubin, Total 10/08/2012 0.6   Final   Past Medical History  Diagnosis Date  . Hyperlipidemia   . Hypertension   . Schizophrenia   . Swelling, mass, or lump on face    Current Outpatient Prescriptions on File Prior to Visit  Medication Sig Dispense Refill  . pravastatin (PRAVACHOL) 80 MG tablet Take   1 tablet (80 mg total) by mouth daily.  30 tablet  0   No current facility-administered medications on file prior to visit.   Allergies  Allergen Reactions  . Asa [Aspirin]    History   Social History  . Marital Status: Married    Spouse Name: N/A    Number of Children: N/A  . Years of Education: N/A   Occupational History  . Not on  file.   Social History Main Topics  . Smoking status: Current Every Day Smoker    Types: Cigarettes  . Smokeless tobacco: Not on file  . Alcohol Use: No  . Drug Use: No  . Sexual Activity: Yes     Comment: married   Other Topics Concern  . Not on file   Social History Narrative  . No narrative on file   Family History  Problem Relation Age of Onset  . Cancer Father     prostate       Review of Systems  All other systems reviewed and are negative.       Objective:   Physical Exam  Vitals reviewed. Constitutional: He is oriented to person, place, and time. He appears well-developed and well-nourished. No distress.  HENT:  Head: Normocephalic and atraumatic.  Right Ear: External ear normal.  Left Ear: External ear normal.  Nose: Nose normal.  Mouth/Throat: Oropharynx is clear and moist. No oropharyngeal exudate.  Eyes: Conjunctivae and EOM are normal. Pupils are equal, round, and reactive to light. Right eye exhibits no discharge. Left eye exhibits no discharge. No scleral icterus.  Neck: Neck supple. No JVD present. No tracheal deviation present. No thyromegaly present.  Cardiovascular: Normal rate, regular rhythm, normal heart sounds and intact distal pulses.  Exam reveals no gallop and no friction rub.   No murmur heard. Pulmonary/Chest: Effort normal. He has wheezes. He has no rales. He exhibits no tenderness.  Abdominal: Soft. Bowel sounds are normal. He exhibits no distension and no mass. There is no tenderness. There is no rebound and no guarding.  Musculoskeletal: Normal range of motion. He exhibits no edema and no tenderness.  Neurological: He is alert and oriented to person, place, and time. He has normal reflexes. He displays normal reflexes. No cranial nerve deficit. He exhibits normal muscle tone. Coordination normal.  Skin: Skin is warm. No rash noted. He is not diaphoretic. No erythema. No pallor.  Psychiatric: His affect is angry. His speech is  tangential. He is agitated, aggressive and combative. Thought content is paranoid and delusional. Cognition and memory are normal. He expresses inappropriate judgment.          Assessment & Plan:  Paranoid schizophrenia - Plan: QUEtiapine (SEROQUEL) 25 MG tablet  Routine general medical examination at a health care facility   Patient is here for a physical exam but he refuses to allow me to do a prostate examination.  He refuses a colonoscopy or any stool screening cards. He also refuses shingles vaccine, flu shot, tetanus shot, and a pneumonia vaccine. Although not physically aggressive he is very verbally combative and aggressive and does not want any kind of interventions performed. His blood pressure is acceptably controlled with his current medication. I will discontinue pravastatin and switch the patient to Lipitor 80 mg a day to try to decrease his LDL cholesterol below 100. I have recommended smoking cessation but the patient refuses. I recommended ENT evaluation and surgical excision of the large mass on his right face. He refuses any evaluation of  this mass and becomes very agitated and angry when discussed with him. I believe the patient has paranoid schizophrenia. I recommended starting the patient onset 25 mg by mouth twice a day and follow up in one month.

## 2013-11-09 IMAGING — US US CAROTID DUPLEX BILAT
1 series · 13 of 24 positions shown · non-contrast
Comparison: None.

CLINICAL DATA: Asymptomatic right-sided carotid bruit, history
smoking

BILATERAL CAROTID DUPLEX ULTRASOUND
TECHNIQUE: Gray scale imaging, color Doppler and duplex ultrasound
was performed of bilateral carotid and vertebral arteries in the
neck.

[Series 1: us carotid duplex bilat · 0.08mm/px · 13 of 59 slices shown]
[im 1/59]
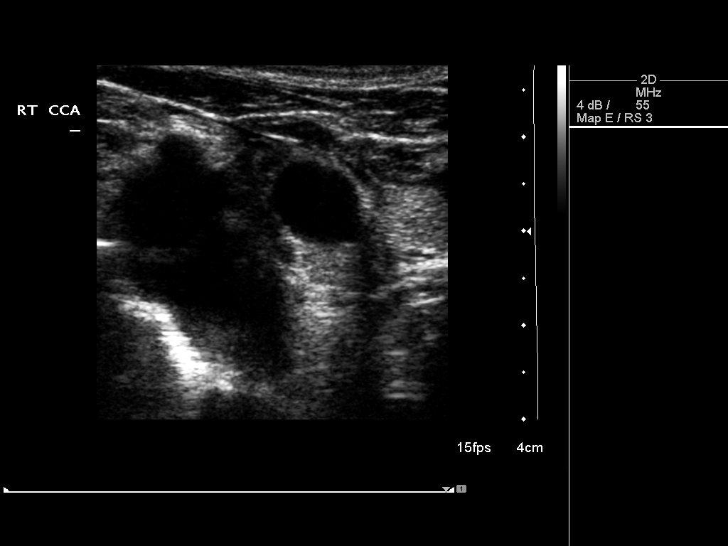
[im 6/59]
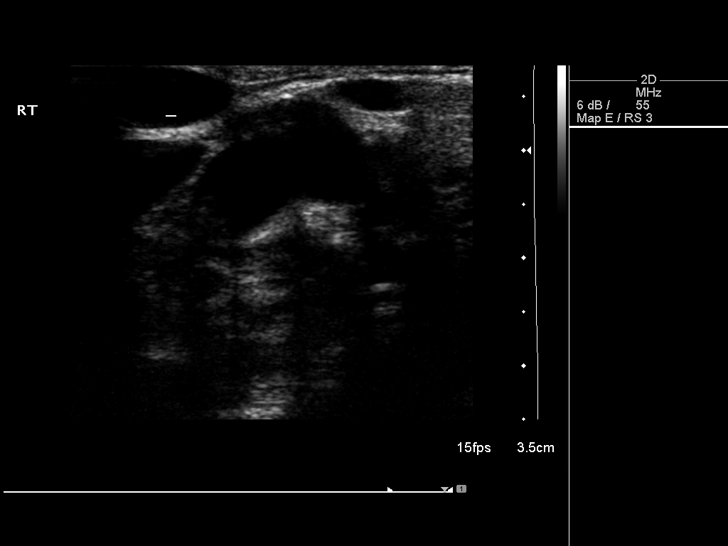
[im 11/59]
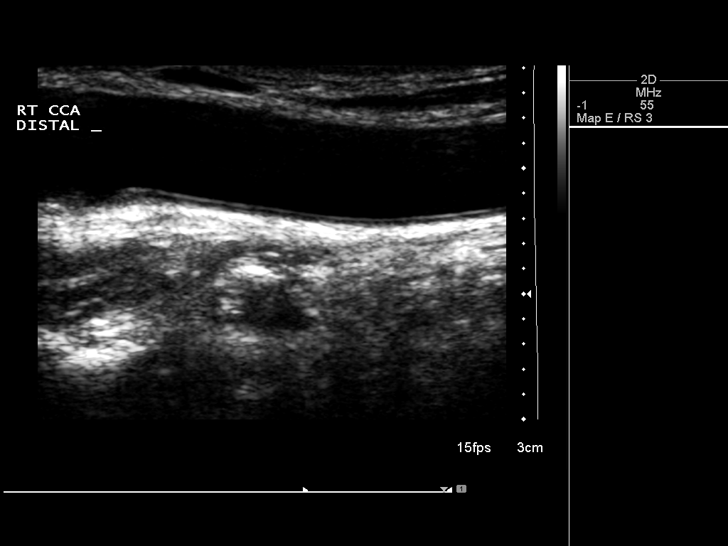
[im 16/59]
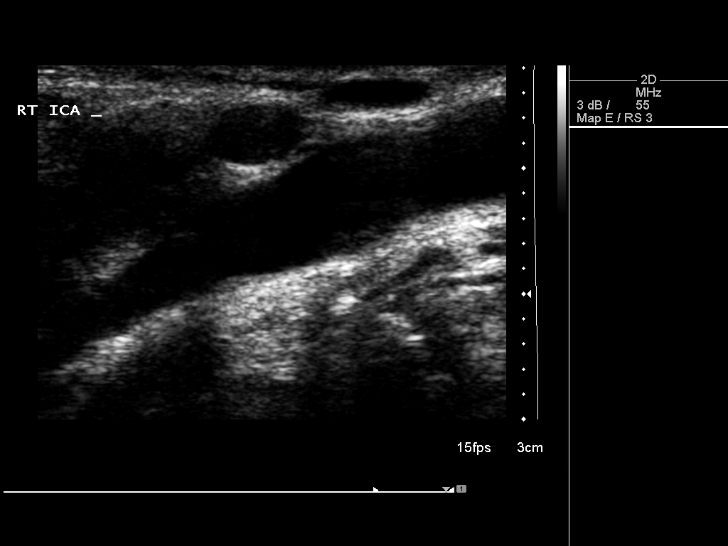
[im 21/59]
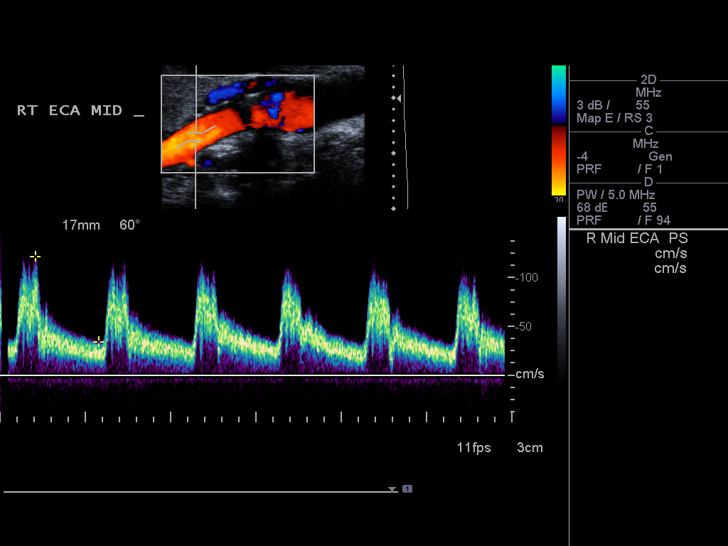
[im 26/59]
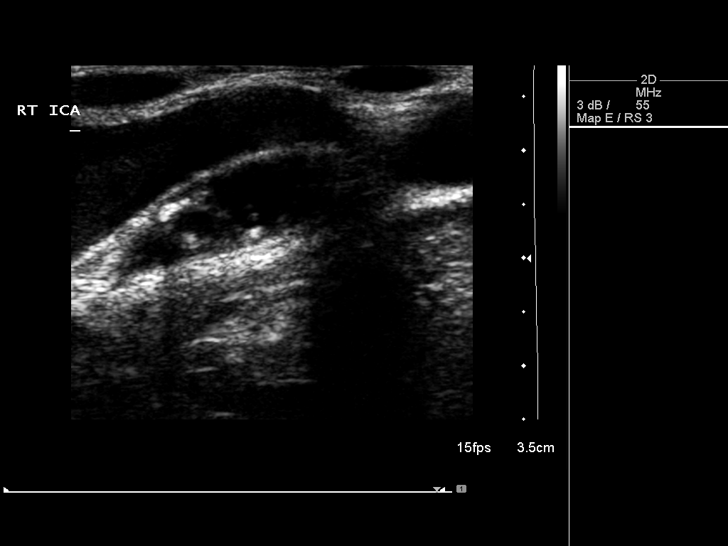
[im 31/59]
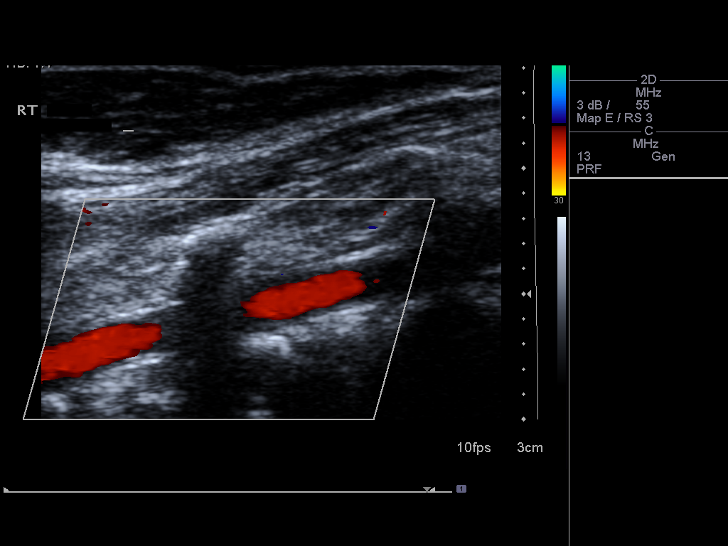
[im 33/59]
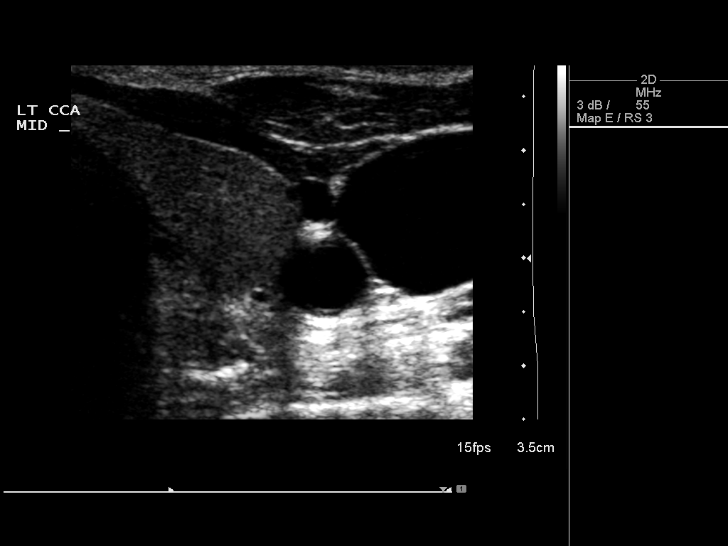
[im 38/59]
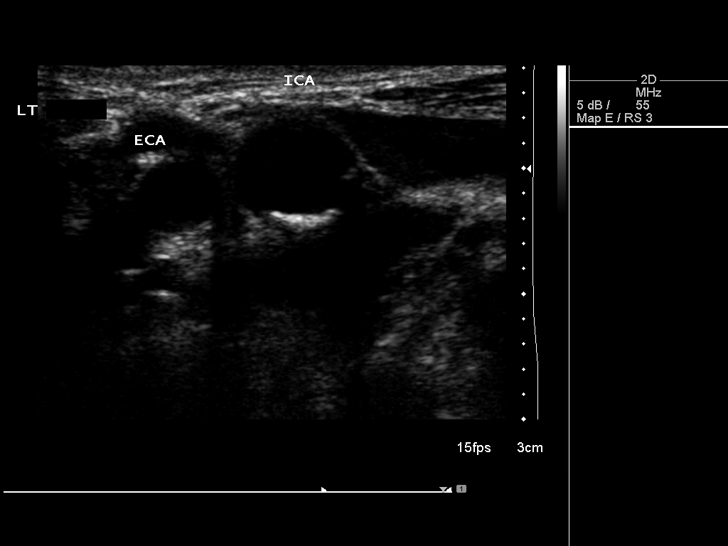
[im 43/59]
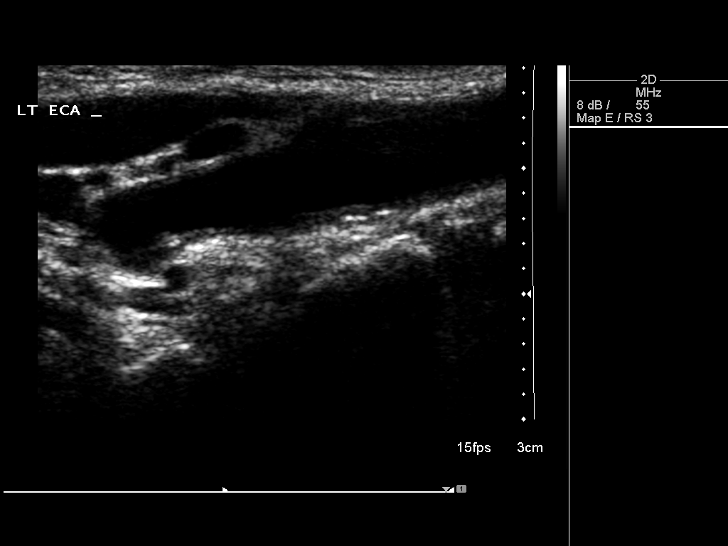
[im 48/59]
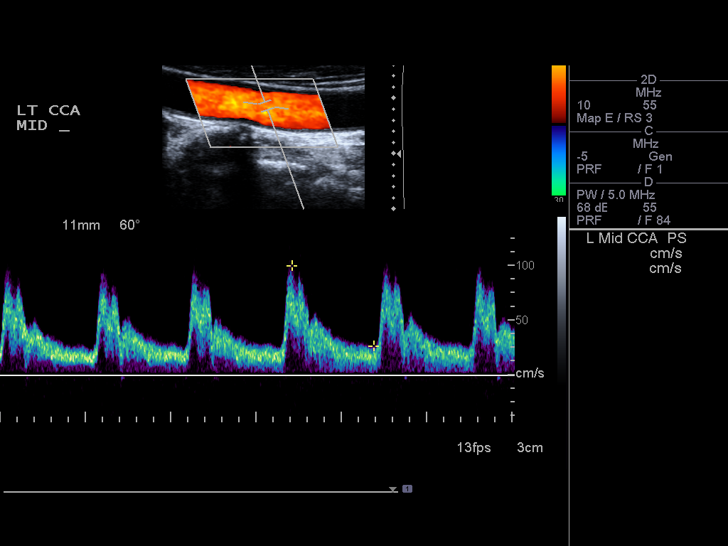
[im 53/59]
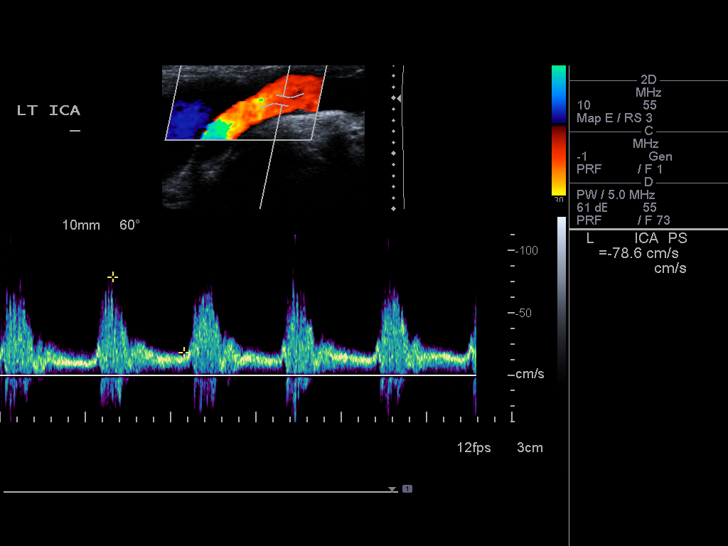
[im 59/59]
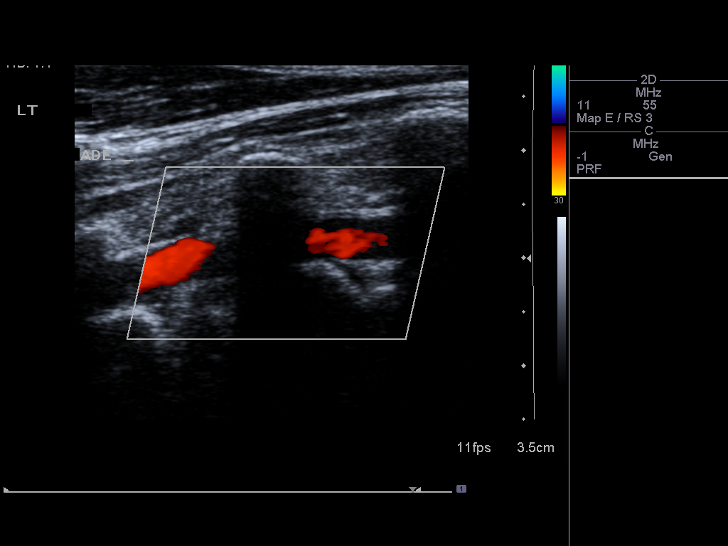

[13 of 24 positions shown; findings below may reference images not displayed]

Criteria:  Quantification of carotid stenosis is based on velocity
parameters that correlate the residual internal carotid diameter
with NASCET-based stenosis levels, using the diameter of the distal
internal carotid lumen as the denominator for stenosis measurement.

The following velocity measurements were obtained:

                 PEAK SYSTOLIC/END DIASTOLIC
RIGHT
ICA:                        94/15cm/sec
CCA:                        118/29cm/sec
SYSTOLIC ICA/CCA RATIO:
DIASTOLIC ICA/CCA RATIO:
ECA:                        91cm/sec

LEFT
ICA:                        91/33cm/sec
CCA:                        115/35cm/sec
SYSTOLIC ICA/CCA RATIO:
DIASTOLIC ICA/CCA RATIO:
ECA:                        110cm/sec
FINDINGS: RIGHT CAROTID ARTERY: There is a minimal amount of eccentric
intimal wall thickening and mixed echogenic plaque within the
origin and proximal aspect of the right internal carotid artery
(images 26 through 28) which does not result in elevated peak
systolic velocities within the right internal carotid artery to
suggest hemodynamically significant stenosis.

RIGHT VERTEBRAL ARTERY:  Antegrade flow

LEFT CAROTID ARTERY: There is circumferential intimal wall
thickening within the left carotid bulb (image 37) with an
eccentric focus of echogenic partially shadowing plaque involving
the origin and proximal aspect of the left internal carotid artery
(image 47), not resulting elevated peak systolic velocities within
the left internal carotid artery to suggest hemodynamically
significant stenosis.

LEFT VERTEBRAL ARTERY:  Antegrade flow
IMPRESSION: Minimal amount of bilateral atherosclerotic plaque, right
subjectively greater than left, not resulting in hemodynamically
significant stenosis.

## 2013-12-09 ENCOUNTER — Encounter: Payer: Self-pay | Admitting: Family Medicine

## 2014-08-13 ENCOUNTER — Other Ambulatory Visit: Payer: Self-pay | Admitting: Family Medicine

## 2014-11-07 ENCOUNTER — Other Ambulatory Visit: Payer: Self-pay | Admitting: Family Medicine

## 2015-01-25 ENCOUNTER — Encounter: Payer: Self-pay | Admitting: Family Medicine

## 2015-01-25 ENCOUNTER — Ambulatory Visit (INDEPENDENT_AMBULATORY_CARE_PROVIDER_SITE_OTHER): Payer: Medicare Other | Admitting: Family Medicine

## 2015-01-25 VITALS — BP 126/74 | HR 68 | Temp 97.8°F | Resp 14 | Ht 71.0 in | Wt 136.0 lb

## 2015-01-25 DIAGNOSIS — Z1211 Encounter for screening for malignant neoplasm of colon: Secondary | ICD-10-CM | POA: Diagnosis not present

## 2015-01-25 DIAGNOSIS — Z0000 Encounter for general adult medical examination without abnormal findings: Secondary | ICD-10-CM

## 2015-01-25 DIAGNOSIS — F200 Paranoid schizophrenia: Secondary | ICD-10-CM | POA: Diagnosis not present

## 2015-01-25 DIAGNOSIS — I10 Essential (primary) hypertension: Secondary | ICD-10-CM | POA: Diagnosis not present

## 2015-01-25 DIAGNOSIS — Z125 Encounter for screening for malignant neoplasm of prostate: Secondary | ICD-10-CM | POA: Diagnosis not present

## 2015-01-25 DIAGNOSIS — E785 Hyperlipidemia, unspecified: Secondary | ICD-10-CM | POA: Diagnosis not present

## 2015-01-25 LAB — LIPID PANEL
CHOL/HDL RATIO: 4 ratio
Cholesterol: 127 mg/dL (ref 0–200)
HDL: 32 mg/dL — ABNORMAL LOW (ref >=40)
LDL Cholesterol: 84 mg/dL (ref 0–99)
Triglycerides: 56 mg/dL (ref <150)
VLDL: 11 mg/dL (ref 0–40)

## 2015-01-25 LAB — CBC WITH DIFFERENTIAL/PLATELET
BASOS ABS: 0 10*3/uL (ref 0.0–0.1)
BASOS PCT: 0 % (ref 0–1)
EOS ABS: 0.1 10*3/uL (ref 0.0–0.7)
EOS PCT: 1 % (ref 0–5)
HCT: 37 % — ABNORMAL LOW (ref 39.0–52.0)
HEMOGLOBIN: 12.2 g/dL — AB (ref 13.0–17.0)
LYMPHS PCT: 42 % (ref 12–46)
Lymphs Abs: 2.8 10*3/uL (ref 0.7–4.0)
MCH: 33.4 pg (ref 26.0–34.0)
MCHC: 33 g/dL (ref 30.0–36.0)
MCV: 101.4 fL — ABNORMAL HIGH (ref 78.0–100.0)
MPV: 11 fL (ref 8.6–12.4)
Monocytes Absolute: 0.5 10*3/uL (ref 0.1–1.0)
Monocytes Relative: 7 % (ref 3–12)
NEUTROS ABS: 3.3 10*3/uL (ref 1.7–7.7)
NEUTROS PCT: 50 % (ref 43–77)
PLATELETS: 137 10*3/uL — AB (ref 150–400)
RBC: 3.65 MIL/uL — AB (ref 4.22–5.81)
RDW: 14.6 % (ref 11.5–15.5)
WBC: 6.6 10*3/uL (ref 4.0–10.5)

## 2015-01-25 LAB — COMPLETE METABOLIC PANEL WITH GFR
ALBUMIN: 3.6 g/dL (ref 3.5–5.2)
ALK PHOS: 59 U/L (ref 39–117)
ALT: 10 U/L (ref 0–53)
AST: 15 U/L (ref 0–37)
BUN: 17 mg/dL (ref 6–23)
CO2: 26 meq/L (ref 19–32)
CREATININE: 1.19 mg/dL (ref 0.50–1.35)
Calcium: 8.9 mg/dL (ref 8.4–10.5)
Chloride: 107 mEq/L (ref 96–112)
GFR, EST AFRICAN AMERICAN: 70 mL/min
GFR, EST NON AFRICAN AMERICAN: 61 mL/min
GLUCOSE: 101 mg/dL — AB (ref 70–99)
Potassium: 3.9 mEq/L (ref 3.5–5.3)
Sodium: 142 mEq/L (ref 135–145)
TOTAL PROTEIN: 6.4 g/dL (ref 6.0–8.3)
Total Bilirubin: 0.5 mg/dL (ref 0.2–1.2)

## 2015-01-25 NOTE — Progress Notes (Signed)
Subjective:    Patient ID: William Madden, male    DOB: 1941/08/19, 74 y.o.   MRN: [id number]  HPI Patient is here today accompanied by his wife. He has a history of paranoid schizophrenia. He refuses all medication for this. As usual, the patient is very belligerent, mad, and refuses all medical care. I found the best thing is just to ignore him and to not address his complaints. He has a large mass on the right side of his face. He refuses any workup or surgical excision of this mass. When I discussed this mass with him in the past it is made him extremely belligerent and upset. Again today he refuses to discuss this mass with me. He also has a history of hypertension and hyperlipidemia. His wife can get him to take his Zestoretic and Lipitor. He is due for fasting lab work. He denies any complaints although if he were having problems I doubt that he would tell me. He refuses Pneumovax 23. He refuses Prevnar 13. He refuses Zostavax. He refuses a colonoscopy. He refuses a digital rectal exam. He will consent to blood work and therefore I can check his prostate with a PSA. His wife believes that he will do the fecal occult blood cards to check for colon cancer although he absolutely refuses a colonoscopy. Past Medical History  Diagnosis Date  . Hyperlipidemia   . Hypertension   . Schizophrenia   . Swelling, mass, or lump on face    No past surgical history on file. Current Outpatient Prescriptions on File Prior to Visit  Medication Sig Dispense Refill  . atorvastatin (LIPITOR) 80 MG tablet TAKE 1 TABLET EVERY DAY 90 tablet 0  . lisinopril-hydrochlorothiazide (PRINZIDE,ZESTORETIC) 20-12.5 MG per tablet TAKE 1 TABLET BY MOUTH DAILY. 90 tablet 0   No current facility-administered medications on file prior to visit.   Allergies  Allergen Reactions  . Asa [Aspirin]    History   Social History  . Marital Status: Married    Spouse Name: N/A  . Number of Children: N/A  . Years of Education:  N/A   Occupational History  . Not on file.   Social History Main Topics  . Smoking status: Current Every Day Smoker    Types: Cigarettes  . Smokeless tobacco: Not on file  . Alcohol Use: No  . Drug Use: No  . Sexual Activity: Yes     Comment: married   Other Topics Concern  . Not on file   Social History Narrative   Family History  Problem Relation Age of Onset  . Cancer Father     prostate      Review of Systems  All other systems reviewed and are negative.      Objective:   Physical Exam  Constitutional: He is oriented to person, place, and time. He appears well-developed and well-nourished. No distress.  HENT:  Head: Normocephalic and atraumatic.  Right Ear: External ear normal.  Left Ear: External ear normal.  Nose: Nose normal.  Mouth/Throat: Oropharynx is clear and moist. No oropharyngeal exudate.  Eyes: Conjunctivae and EOM are normal. Pupils are equal, round, and reactive to light. Right eye exhibits no discharge. Left eye exhibits no discharge. No scleral icterus.  Neck: Normal range of motion. Neck supple. No JVD present. No tracheal deviation present. No thyromegaly present.  Cardiovascular: Normal rate, regular rhythm, normal heart sounds and intact distal pulses.  Exam reveals no gallop and no friction rub.   No murmur heard.  Pulmonary/Chest: Effort normal. No stridor. No respiratory distress. He has wheezes. He has no rales. He exhibits no tenderness.  Abdominal: Soft. Bowel sounds are normal. He exhibits no distension and no mass. There is no tenderness. There is no rebound and no guarding.  Musculoskeletal: Normal range of motion. He exhibits no edema or tenderness.  Lymphadenopathy:    He has no cervical adenopathy.  Neurological: He is alert and oriented to person, place, and time. He has normal reflexes. He displays normal reflexes. No cranial nerve deficit. He exhibits normal muscle tone. Coordination normal.  Skin: Skin is warm. No rash noted.  He is not diaphoretic. No erythema. No pallor.  Psychiatric: His affect is angry. He is agitated. Thought content is paranoid. He expresses inappropriate judgment. He is noncommunicative.  Vitals reviewed.  large mass on the right side of the face approximate the size of a tennis ball. Patient also has bilateral cataracts but he refuses any treatment for these        Assessment & Plan:  HLD (hyperlipidemia) - Plan: COMPLETE METABOLIC PANEL WITH GFR, Lipid panel  Prostate cancer screening - Plan: PSA, Medicare  Benign essential HTN - Plan: CBC with Differential/Platelet  Colon cancer screening - Plan: Fecal occult blood, imunochemical, Fecal occult blood, imunochemical, Fecal occult blood, imunochemical  Routine general medical examination at a health care facility  Paranoid schizophrenia  In accordance with the patient's wishes, I will not schedule the patient for colonoscopy. I did not perform a digital rectal exam. I did not administer any immunization. He will consent to lab work and therefore I will check a CBC, CMP, fasting lipid panel, and PSA. Also gave the patient 3 stool cards to go home with in an attempt to screen for colon cancer. I did recommend a colonoscopy and a digital rectal exam but he refuses this and becomes agitated when I presented the option to him. Also recommended standard vaccinations but he refuses this as well.

## 2015-01-26 LAB — PSA, MEDICARE: PSA: 3.91 ng/mL (ref <=4.00)

## 2015-02-03 ENCOUNTER — Other Ambulatory Visit: Payer: Self-pay | Admitting: Family Medicine

## 2015-02-07 ENCOUNTER — Other Ambulatory Visit: Payer: Self-pay | Admitting: Family Medicine

## 2015-02-07 MED ORDER — ATORVASTATIN CALCIUM 80 MG PO TABS: 80.0000 mg | ORAL_TABLET | Freq: Every day | ORAL | Status: DC

## 2015-02-09 ENCOUNTER — Telehealth: Payer: Self-pay | Admitting: Family Medicine

## 2015-02-09 NOTE — Telephone Encounter (Signed)
Wife calling to let dr pickard know that patient does not want to try new medication discussed at last visit. [phone]

## 2015-02-12 NOTE — Telephone Encounter (Signed)
ok 

## 2016-02-06 ENCOUNTER — Other Ambulatory Visit: Payer: Self-pay | Admitting: Family Medicine

## 2016-02-08 ENCOUNTER — Encounter: Payer: Self-pay | Admitting: Family Medicine

## 2016-02-08 ENCOUNTER — Ambulatory Visit (INDEPENDENT_AMBULATORY_CARE_PROVIDER_SITE_OTHER): Payer: Medicare Other | Admitting: Family Medicine

## 2016-02-08 VITALS — BP 122/78 | HR 68 | Temp 97.8°F | Resp 16 | Ht 71.0 in | Wt 132.0 lb

## 2016-02-08 DIAGNOSIS — E785 Hyperlipidemia, unspecified: Secondary | ICD-10-CM

## 2016-02-08 DIAGNOSIS — F200 Paranoid schizophrenia: Secondary | ICD-10-CM

## 2016-02-08 DIAGNOSIS — Z125 Encounter for screening for malignant neoplasm of prostate: Secondary | ICD-10-CM | POA: Diagnosis not present

## 2016-02-08 DIAGNOSIS — I10 Essential (primary) hypertension: Secondary | ICD-10-CM

## 2016-02-08 LAB — COMPLETE METABOLIC PANEL WITH GFR
ALBUMIN: 3.3 g/dL — AB (ref 3.6–5.1)
ALK PHOS: 61 U/L (ref 40–115)
ALT: 8 U/L — AB (ref 9–46)
AST: 13 U/L (ref 10–35)
BUN: 16 mg/dL (ref 7–25)
CALCIUM: 8.4 mg/dL — AB (ref 8.6–10.3)
CHLORIDE: 107 mmol/L (ref 98–110)
CO2: 24 mmol/L (ref 20–31)
CREATININE: 1.13 mg/dL (ref 0.70–1.18)
GFR, EST AFRICAN AMERICAN: 74 mL/min (ref >=60)
GFR, Est Non African American: 64 mL/min (ref >=60)
Glucose, Bld: 86 mg/dL (ref 70–99)
POTASSIUM: 4 mmol/L (ref 3.5–5.3)
Sodium: 139 mmol/L (ref 135–146)
Total Bilirubin: 0.5 mg/dL (ref 0.2–1.2)
Total Protein: 6 g/dL — ABNORMAL LOW (ref 6.1–8.1)

## 2016-02-08 LAB — CBC WITH DIFFERENTIAL/PLATELET
Basophils Absolute: 0 cells/uL (ref 0–200)
Basophils Relative: 0 %
EOS ABS: 62 {cells}/uL (ref 15–500)
Eosinophils Relative: 1 %
HEMATOCRIT: 36.2 % — AB (ref 38.5–50.0)
HEMOGLOBIN: 12.1 g/dL — AB (ref 13.0–17.0)
LYMPHS PCT: 29 %
Lymphs Abs: 1798 cells/uL (ref 850–3900)
MCH: 33.7 pg — ABNORMAL HIGH (ref 27.0–33.0)
MCHC: 33.4 g/dL (ref 32.0–36.0)
MCV: 100.8 fL — AB (ref 80.0–100.0)
MONO ABS: 434 {cells}/uL (ref 200–950)
MPV: 10.7 fL (ref 7.5–12.5)
Monocytes Relative: 7 %
NEUTROS PCT: 63 %
Neutro Abs: 3906 cells/uL (ref 1500–7800)
Platelets: 118 10*3/uL — ABNORMAL LOW (ref 140–400)
RBC: 3.59 MIL/uL — ABNORMAL LOW (ref 4.20–5.80)
RDW: 14.3 % (ref 11.0–15.0)
WBC: 6.2 10*3/uL (ref 3.8–10.8)

## 2016-02-08 LAB — LIPID PANEL
CHOL/HDL RATIO: 3.2 ratio (ref <=5.0)
CHOLESTEROL: 109 mg/dL — AB (ref 125–200)
HDL: 34 mg/dL — ABNORMAL LOW (ref >=40)
LDL Cholesterol: 64 mg/dL (ref <130)
TRIGLYCERIDES: 56 mg/dL (ref <150)
VLDL: 11 mg/dL (ref <30)

## 2016-02-08 NOTE — Progress Notes (Signed)
Subjective:    Patient ID: William Madden, male    DOB: 08/27/1941, 75 y.o.   MRN: [id number]  HPI 01/25/15 Patient is here today accompanied by his wife. He has a history of paranoid schizophrenia. He refuses all medication for this. As usual, the patient is very belligerent, mad, and refuses all medical care. I found the best thing is just to ignore him and to not address his complaints. He has a large mass on the right side of his face. He refuses any workup or surgical excision of this mass. When I discussed this mass with him in the past it is made him extremely belligerent and upset. Again today he refuses to discuss this mass with me. He also has a history of hypertension and hyperlipidemia. His wife can get him to take his Zestoretic and Lipitor. He is due for fasting lab work. He denies any complaints although if he were having problems I doubt that he would tell me. He refuses Pneumovax 23. He refuses Prevnar 13. He refuses Zostavax. He refuses a colonoscopy. He refuses a digital rectal exam. He will consent to blood work and therefore I can check his prostate with a PSA. His wife believes that he will do the fecal occult blood cards to check for colon cancer although he absolutely refuses a colonoscopy.  At that time, my plan was: In accordance with the patient's wishes, I will not schedule the patient for colonoscopy. I did not perform a digital rectal exam. I did not administer any immunization. He will consent to lab work and therefore I will check a CBC, CMP, fasting lipid panel, and PSA. Also gave the patient 3 stool cards to go home with in an attempt to screen for colon cancer. I did recommend a colonoscopy and a digital rectal exam but he refuses this and becomes agitated when I presented the option to him. Also recommended standard vaccinations but he refuses this as well.  02/08/16 Patient greets me with his normal "I am wasting my time."  He is very angry and mad that he has to be here.  Uses all review of systems questions. He denies any pain anywhere becomes belligerent when asking more questions. He has past medical history of hypertension hyperlipidemia and paranoid schizophrenia. His wife is able to get him to take his medication. His blood pressure is excellent at 122/78. He is due for fasting lab work. He refuses hepatitis C screening and HIV screening. He continues to refuse colonoscopy and he refuses the stool cards. He did not return the last time. He refuses any vaccinations. He continues to smoke and has no desire to quit smoking. Past Medical History  Diagnosis Date  . Hyperlipidemia   . Hypertension   . Schizophrenia (HCC)   . Swelling, mass, or lump on face    No past surgical history on file. Current Outpatient Prescriptions on File Prior to Visit  Medication Sig Dispense Refill  . atorvastatin (LIPITOR) 80 MG tablet TAKE 1 TABLET BY MOUTH EVERY DAY 90 tablet 3  . lisinopril-hydrochlorothiazide (PRINZIDE,ZESTORETIC) 20-12.5 MG tablet TAKE 1 TABLET BY MOUTH DAILY. 90 tablet 3   No current facility-administered medications on file prior to visit.   Allergies  Allergen Reactions  . Jonne PlyAsa [Aspirin]    Social History   Social History  . Marital Status: Married    Spouse Name: N/A  . Number of Children: N/A  . Years of Education: N/A   Occupational History  . Not on  file.   Social History Main Topics  . Smoking status: Current Every Day Smoker    Types: Cigarettes  . Smokeless tobacco: Not on file  . Alcohol Use: No  . Drug Use: No  . Sexual Activity: Yes     Comment: married   Other Topics Concern  . Not on file   Social History Narrative   Family History  Problem Relation Age of Onset  . Cancer Father     prostate      Review of Systems  All other systems reviewed and are negative.      Objective:   Physical Exam  Constitutional: He is oriented to person, place, and time. He appears well-developed and well-nourished. No distress.    HENT:  Head: Normocephalic and atraumatic.  Right Ear: External ear normal.  Left Ear: External ear normal.  Nose: Nose normal.  Mouth/Throat: Oropharynx is clear and moist. No oropharyngeal exudate.  Eyes: Conjunctivae and EOM are normal. Pupils are equal, round, and reactive to light. Right eye exhibits no discharge. Left eye exhibits no discharge. No scleral icterus.  Neck: Normal range of motion. Neck supple. No JVD present. No tracheal deviation present. No thyromegaly present.  Cardiovascular: Normal rate, regular rhythm, normal heart sounds and intact distal pulses.  Exam reveals no gallop and no friction rub.   No murmur heard. Pulmonary/Chest: Effort normal. No stridor. No respiratory distress. He has wheezes. He has no rales. He exhibits no tenderness.  Abdominal: Soft. Bowel sounds are normal. He exhibits no distension and no mass. There is no tenderness. There is no rebound and no guarding.  Musculoskeletal: Normal range of motion. He exhibits no edema or tenderness.  Lymphadenopathy:    He has no cervical adenopathy.  Neurological: He is alert and oriented to person, place, and time. He has normal reflexes. No cranial nerve deficit. He exhibits normal muscle tone. Coordination normal.  Skin: Skin is warm. No rash noted. He is not diaphoretic. No erythema. No pallor.  Psychiatric: His affect is angry. He is agitated. Thought content is paranoid. He expresses inappropriate judgment. He is noncommunicative.  Vitals reviewed.  large mass on the right side of the face approximate the size of a tennis ball. Patient also has bilateral cataracts but he refuses any treatment for these        Assessment & Plan:  Benign essential HTN - Plan: CBC with Differential/Platelet, COMPLETE METABOLIC PANEL WITH GFR, Lipid panel  Prostate cancer screening - Plan: PSA  HLD (hyperlipidemia)  Paranoid schizophrenia (HCC)  This is a very difficult case. The patient is not a danger to himself  or to anyone else. His wife is able to control his anger at home. As long as he doesn't have to go to the doctor do something against his wishes he presents no problems. However he refuses any preventative care and pushing him on these issues only makes him more upset. Therefore I will continue to treat what I can and control the risk factors that I'm able to control such as high blood pressure and high cholesterol. Continue to recommend smoking cessation, colonoscopy, hepatitis C screening, HIV screening, digital rectal exam but the patient refuses all of these along with standard immunizations. He will consent to some basic blood work as long as it "allows me to leave this forsaken place" according to his words.

## 2016-02-09 LAB — PSA: PSA: 3.86 ng/mL (ref <=4.00)

## 2016-02-13 ENCOUNTER — Encounter: Payer: Self-pay | Admitting: Family Medicine

## 2017-01-24 ENCOUNTER — Other Ambulatory Visit: Payer: Self-pay | Admitting: Family Medicine

## 2017-03-05 ENCOUNTER — Encounter: Payer: Self-pay | Admitting: Family Medicine

## 2017-03-05 ENCOUNTER — Ambulatory Visit (INDEPENDENT_AMBULATORY_CARE_PROVIDER_SITE_OTHER): Payer: Medicare Other | Admitting: Family Medicine

## 2017-03-05 VITALS — BP 112/62 | HR 64 | Temp 98.3°F | Resp 14 | Ht 71.0 in | Wt 124.0 lb

## 2017-03-05 DIAGNOSIS — I10 Essential (primary) hypertension: Secondary | ICD-10-CM | POA: Diagnosis not present

## 2017-03-05 DIAGNOSIS — Z125 Encounter for screening for malignant neoplasm of prostate: Secondary | ICD-10-CM

## 2017-03-05 DIAGNOSIS — E7800 Pure hypercholesterolemia, unspecified: Secondary | ICD-10-CM | POA: Diagnosis not present

## 2017-03-05 LAB — COMPLETE METABOLIC PANEL WITH GFR
ALBUMIN: 3.2 g/dL — AB (ref 3.6–5.1)
ALK PHOS: 75 U/L (ref 40–115)
ALT: 7 U/L — ABNORMAL LOW (ref 9–46)
AST: 11 U/L (ref 10–35)
BILIRUBIN TOTAL: 0.4 mg/dL (ref 0.2–1.2)
BUN: 15 mg/dL (ref 7–25)
CO2: 21 mmol/L (ref 20–31)
Calcium: 8.5 mg/dL — ABNORMAL LOW (ref 8.6–10.3)
Chloride: 105 mmol/L (ref 98–110)
Creat: 1.11 mg/dL (ref 0.70–1.18)
GFR, EST AFRICAN AMERICAN: 75 mL/min (ref >=60)
GFR, Est Non African American: 65 mL/min (ref >=60)
Glucose, Bld: 93 mg/dL (ref 70–99)
Potassium: 4.4 mmol/L (ref 3.5–5.3)
Sodium: 138 mmol/L (ref 135–146)
TOTAL PROTEIN: 6.3 g/dL (ref 6.1–8.1)

## 2017-03-05 LAB — CBC WITH DIFFERENTIAL/PLATELET
BASOS ABS: 0 {cells}/uL (ref 0–200)
Basophils Relative: 0 %
EOS ABS: 83 {cells}/uL (ref 15–500)
Eosinophils Relative: 1 %
HCT: 33.9 % — ABNORMAL LOW (ref 38.5–50.0)
HEMOGLOBIN: 11 g/dL — AB (ref 13.0–17.0)
Lymphocytes Relative: 27 %
Lymphs Abs: 2241 cells/uL (ref 850–3900)
MCH: 32.6 pg (ref 27.0–33.0)
MCHC: 32.4 g/dL (ref 32.0–36.0)
MCV: 100.6 fL — AB (ref 80.0–100.0)
MPV: 10 fL (ref 7.5–12.5)
Monocytes Absolute: 664 cells/uL (ref 200–950)
Monocytes Relative: 8 %
NEUTROS ABS: 5312 {cells}/uL (ref 1500–7800)
Neutrophils Relative %: 64 %
Platelets: 152 10*3/uL (ref 140–400)
RBC: 3.37 MIL/uL — ABNORMAL LOW (ref 4.20–5.80)
RDW: 14.3 % (ref 11.0–15.0)
WBC: 8.3 10*3/uL (ref 3.8–10.8)

## 2017-03-05 LAB — LIPID PANEL
Cholesterol: 105 mg/dL (ref <200)
HDL: 28 mg/dL — AB (ref >40)
LDL Cholesterol: 66 mg/dL (ref <100)
TRIGLYCERIDES: 53 mg/dL (ref <150)
Total CHOL/HDL Ratio: 3.8 Ratio (ref <5.0)
VLDL: 11 mg/dL (ref <30)

## 2017-03-05 NOTE — Progress Notes (Signed)
Subjective:    Patient ID: William Madden, male    DOB: June 02, 1941, 76 y.o.   MRN: [id number]  HPI5/26/16 Patient is here today accompanied by his wife. He has a history of paranoid schizophrenia. He refuses all medication for this. As usual, the patient is very belligerent, mad, and refuses all medical care. I found the best thing is just to ignore him and to not address his complaints. He has a large mass on the right side of his face. He refuses any workup or surgical excision of this mass. When I discussed this mass with him in the past it is made him extremely belligerent and upset. Again today he refuses to discuss this mass with me. He also has a history of hypertension and hyperlipidemia. His wife can get him to take his Zestoretic and Lipitor. He is due for fasting lab work. He denies any complaints although if he were having problems I doubt that he would tell me. He refuses Pneumovax 23. He refuses Prevnar 13. He refuses Zostavax. He refuses a colonoscopy. He refuses a digital rectal exam. He will consent to blood work and therefore I can check his prostate with a PSA. His wife believes that he will do the fecal occult blood cards to check for colon cancer although he absolutely refuses a colonoscopy.  At that time, my plan was: In accordance with the patient's wishes, I will not schedule the patient for colonoscopy. I did not perform a digital rectal exam. I did not administer any immunization. He will consent to lab work and therefore I will check a CBC, CMP, fasting lipid panel, and PSA. Also gave the patient 3 stool cards to go home with in an attempt to screen for colon cancer. I did recommend a colonoscopy and a digital rectal exam but he refuses this and becomes agitated when I presented the option to him. Also recommended standard vaccinations but he refuses this as well.  02/08/16 Patient greets me with his normal "I am wasting my time."  He is very angry and mad that he has to be here.  Uses all review of systems questions. He denies any pain anywhere becomes belligerent when asking more questions. He has past medical history of hypertension hyperlipidemia and paranoid schizophrenia. His wife is able to get him to take his medication. His blood pressure is excellent at 122/78. He is due for fasting lab work. He refuses hepatitis C screening and HIV screening. He continues to refuse colonoscopy and he refuses the stool cards. He did not return the last time. He refuses any vaccinations. He continues to smoke and has no desire to quit smoking.  At that time, my plan was: This is a very difficult case. The patient is not a danger to himself or to anyone else. His wife is able to control his anger at home. As long as he doesn't have to go to the doctor do something against his wishes he presents no problems. However he refuses any preventative care and pushing him on these issues only makes him more upset. Therefore I will continue to treat what I can and control the risk factors that I'm able to control such as high blood pressure and high cholesterol. Continue to recommend smoking cessation, colonoscopy, hepatitis C screening, HIV screening, digital rectal exam but the patient refuses all of these along with standard immunizations. He will consent to some basic blood work as long as it "allows me to leave this forsaken place" according  to his words.    03/05/17 He is here today with his wife. She is extremely pleasant and very patient with him. As is his pattern, he is very belligerent from the moment I walked in the door. I simply said "hello, however you doing" and he told me that he wanted to leave this "damn place".  I documented this just to provide context for how the visit goes. It is impossible to perform a review of systems. He denies any pain or problems. Every question simply causes him to say how he is wasting his time. He refuses all immunizations. He refuses all cancer screening other  than a PSA. His blood pressure today is well controlled at 112/62. However he will not answer my questions regarding chest pain, shortness of breath, dyspnea on exertion. He denies any side effects from the medication. The mass on the right side of his face seems to be larger today. It is now deforming the lower portion of his right ear. However when I asked if this was causing him pain, this only upset him more and therefore I no longer pressed the issue. Past Medical History:  Diagnosis Date  . Hyperlipidemia   . Hypertension   . Schizophrenia (HCC)   . Swelling, mass, or lump on face    No past surgical history on file. Current Outpatient Prescriptions on File Prior to Visit  Medication Sig Dispense Refill  . atorvastatin (LIPITOR) 80 MG tablet TAKE 1 TABLET BY MOUTH EVERY DAY 90 tablet 3  . lisinopril-hydrochlorothiazide (PRINZIDE,ZESTORETIC) 20-12.5 MG tablet TAKE 1 TABLET BY MOUTH DAILY. 90 tablet 3   No current facility-administered medications on file prior to visit.    Allergies  Allergen Reactions  . Jonne Ply [Aspirin]    Social History   Social History  . Marital status: Married    Spouse name: N/A  . Number of children: N/A  . Years of education: N/A   Occupational History  . Not on file.   Social History Main Topics  . Smoking status: Current Every Day Smoker    Types: Cigarettes  . Smokeless tobacco: Never Used  . Alcohol use No  . Drug use: No  . Sexual activity: Yes     Comment: married   Other Topics Concern  . Not on file   Social History Narrative  . No narrative on file   Family History  Problem Relation Age of Onset  . Cancer Father        prostate      Review of Systems  All other systems reviewed and are negative.      Objective:   Physical Exam  Constitutional: He is oriented to person, place, and time. He appears well-developed and well-nourished. No distress.  HENT:  Head: Normocephalic and atraumatic.  Right Ear: External ear  normal.  Left Ear: External ear normal.  Nose: Nose normal.  Mouth/Throat: Oropharynx is clear and moist. No oropharyngeal exudate.  Eyes: Conjunctivae and EOM are normal. Pupils are equal, round, and reactive to light. Right eye exhibits no discharge. Left eye exhibits no discharge. No scleral icterus.  Neck: Normal range of motion. Neck supple. No JVD present. No tracheal deviation present. No thyromegaly present.  Cardiovascular: Normal rate, regular rhythm, normal heart sounds and intact distal pulses.  Exam reveals no gallop and no friction rub.   No murmur heard. Pulmonary/Chest: Effort normal. No stridor. No respiratory distress. He has wheezes. He has no rales. He exhibits no tenderness.  Abdominal: Soft.  Bowel sounds are normal. He exhibits no distension and no mass. There is no tenderness. There is no rebound and no guarding.  Musculoskeletal: Normal range of motion. He exhibits no edema or tenderness.  Lymphadenopathy:    He has no cervical adenopathy.  Neurological: He is alert and oriented to person, place, and time. He has normal reflexes. No cranial nerve deficit. He exhibits normal muscle tone. Coordination normal.  Skin: Skin is warm. No rash noted. He is not diaphoretic. No erythema. No pallor.  Psychiatric: His affect is angry. He is agitated. Thought content is paranoid. He expresses inappropriate judgment. He is noncommunicative.  Vitals reviewed.  large mass on the right side of the face approximate the size of a tennis ball. Patient also has bilateral cataracts but he refuses any treatment for these        Assessment & Plan:  Benign essential HTN - Plan: CBC with Differential/Platelet, COMPLETE METABOLIC PANEL WITH GFR, Lipid panel  Pure hypercholesterolemia - Plan: Lipid panel  Prostate cancer screening - Plan: PSA Patient will not consent to any primary preventative care. He refuses colonoscopy, hepatitis C screening, Pneumovax 23, Prevnar 13, shingles vaccine.  He will consent to basic lab work. His blood pressure remains well controlled. He has no desire to quit smoking. He refuses any surgical referral for the treatment of the large mass on the right side of his face. He will allow me to check a fasting lipid panel as well as a PSA. His PSA has remained stable between 3.9 and 3.85 for the last 24 months.  I do not dismiss this patient simply because I am able to control his cholesterol and his blood pressure providing some benefit for him. Furthermore he refuses care from any doctor.  Therefore, he would not receive better care from any other office.  He refuses all treatment for his paranoid schizophrenia.

## 2017-03-06 LAB — PSA: PSA: 3.4 ng/mL (ref <=4.0)

## 2017-10-21 ENCOUNTER — Other Ambulatory Visit: Payer: Self-pay | Admitting: Family Medicine

## 2017-10-21 MED ORDER — LISINOPRIL-HYDROCHLOROTHIAZIDE 20-12.5 MG PO TABS: 1.0000 | ORAL_TABLET | Freq: Every day | ORAL | 1 refills | Status: DC

## 2017-10-21 MED ORDER — ATORVASTATIN CALCIUM 80 MG PO TABS: 80.0000 mg | ORAL_TABLET | Freq: Every day | ORAL | 1 refills | Status: DC

## 2018-01-19 ENCOUNTER — Other Ambulatory Visit: Payer: Self-pay | Admitting: Family Medicine

## 2018-03-02 ENCOUNTER — Ambulatory Visit (INDEPENDENT_AMBULATORY_CARE_PROVIDER_SITE_OTHER): Payer: Medicare Other | Admitting: Family Medicine

## 2018-03-02 ENCOUNTER — Encounter: Payer: Self-pay | Admitting: Family Medicine

## 2018-03-02 VITALS — BP 130/68 | HR 76 | Temp 98.2°F | Resp 12 | Ht 71.0 in | Wt 128.0 lb

## 2018-03-02 DIAGNOSIS — I10 Essential (primary) hypertension: Secondary | ICD-10-CM

## 2018-03-02 DIAGNOSIS — F203 Undifferentiated schizophrenia: Secondary | ICD-10-CM

## 2018-03-02 DIAGNOSIS — R351 Nocturia: Secondary | ICD-10-CM

## 2018-03-02 DIAGNOSIS — Z125 Encounter for screening for malignant neoplasm of prostate: Secondary | ICD-10-CM

## 2018-03-02 DIAGNOSIS — E7800 Pure hypercholesterolemia, unspecified: Secondary | ICD-10-CM | POA: Diagnosis not present

## 2018-03-02 LAB — COMPLETE METABOLIC PANEL WITH GFR
AG Ratio: 1.3 (calc) (ref 1.0–2.5)
ALKALINE PHOSPHATASE (APISO): 62 U/L (ref 40–115)
ALT: 10 U/L (ref 9–46)
AST: 13 U/L (ref 10–35)
Albumin: 3.6 g/dL (ref 3.6–5.1)
BUN/Creatinine Ratio: 13 (calc) (ref 6–22)
BUN: 16 mg/dL (ref 7–25)
CALCIUM: 8.6 mg/dL (ref 8.6–10.3)
CO2: 24 mmol/L (ref 20–32)
CREATININE: 1.23 mg/dL — AB (ref 0.70–1.18)
Chloride: 106 mmol/L (ref 98–110)
GFR, EST NON AFRICAN AMERICAN: 57 mL/min/{1.73_m2} — AB (ref >=60)
GFR, Est African American: 66 mL/min/{1.73_m2} (ref >=60)
Globulin: 2.7 g/dL (calc) (ref 1.9–3.7)
Glucose, Bld: 92 mg/dL (ref 65–99)
POTASSIUM: 3.8 mmol/L (ref 3.5–5.3)
Sodium: 138 mmol/L (ref 135–146)
Total Bilirubin: 0.7 mg/dL (ref 0.2–1.2)
Total Protein: 6.3 g/dL (ref 6.1–8.1)

## 2018-03-02 LAB — CBC WITH DIFFERENTIAL/PLATELET
BASOS PCT: 0.3 %
Basophils Absolute: 19 cells/uL (ref 0–200)
EOS PCT: 1.7 %
Eosinophils Absolute: 109 cells/uL (ref 15–500)
HCT: 35.1 % — ABNORMAL LOW (ref 38.5–50.0)
Hemoglobin: 11.7 g/dL — ABNORMAL LOW (ref 13.2–17.1)
Lymphs Abs: 2074 cells/uL (ref 850–3900)
MCH: 32.9 pg (ref 27.0–33.0)
MCHC: 33.3 g/dL (ref 32.0–36.0)
MCV: 98.6 fL (ref 80.0–100.0)
MONOS PCT: 8.6 %
MPV: 10.6 fL (ref 7.5–12.5)
Neutro Abs: 3648 cells/uL (ref 1500–7800)
Neutrophils Relative %: 57 %
PLATELETS: 113 10*3/uL — AB (ref 140–400)
RBC: 3.56 10*6/uL — AB (ref 4.20–5.80)
RDW: 12.7 % (ref 11.0–15.0)
TOTAL LYMPHOCYTE: 32.4 %
WBC: 6.4 10*3/uL (ref 3.8–10.8)
WBCMIX: 550 {cells}/uL (ref 200–950)

## 2018-03-02 LAB — LIPID PANEL
CHOL/HDL RATIO: 3.6 (calc) (ref <5.0)
CHOLESTEROL: 120 mg/dL (ref <200)
HDL: 33 mg/dL — AB (ref >40)
LDL Cholesterol (Calc): 73 mg/dL (calc)
NON-HDL CHOLESTEROL (CALC): 87 mg/dL (ref <130)
TRIGLYCERIDES: 56 mg/dL (ref <150)

## 2018-03-02 LAB — PSA: PSA: 3.2 ng/mL (ref <=4.0)

## 2018-03-02 NOTE — Progress Notes (Signed)
Subjective:    Patient ID: William Madden, male    DOB: 25-Jul-1941, 77 y.o.   MRN: [id number]  Medication Refill   Please see previous office visits.  Patient is belligerent when I walk in the door.  He wants to leave.  He states that he hates all doctors.  He has no desire to be here.  His only with tremendous cajoling and bagging that his wife can get him to come once here for lab work.  His blood pressure is well controlled at 130/68.  He refuses to talk to me when I asked him review of systems questions.  He declines a colonoscopy or any preventative care.  He refuses a referral to a psychiatrist.  Patient is not hallucinating.  He is not delusional.  He is not suicidal or homicidal.  However he is extremely angry and belligerent.  His wife reports increasing frequency and urgency.  He refuses to answer any review of systems questions.  However his wife states that he has not been complaining of any pain with urination.  In the past he had to go to the hospital urgently for gross hematuria which was due to BPH and urinary retention.  Patient had to undergo surgery which I suspect was a TURP due to urinary retention urgently in the hospital.  However he has not followed up with urologist since Past Medical History:  Diagnosis Date  . Hyperlipidemia   . Hypertension   . Schizophrenia (HCC)   . Swelling, mass, or lump on face    No past surgical history on file. Current Outpatient Medications on File Prior to Visit  Medication Sig Dispense Refill  . atorvastatin (LIPITOR) 80 MG tablet TAKE 1 TABLET BY MOUTH EVERY DAY 90 tablet 0  . lisinopril-hydrochlorothiazide (PRINZIDE,ZESTORETIC) 20-12.5 MG tablet TAKE 1 TABLET BY MOUTH DAILY. 90 tablet 0   No current facility-administered medications on file prior to visit.    Allergies  Allergen Reactions  . Jonne Ply [Aspirin]    Social History   Socioeconomic History  . Marital status: Married    Spouse name: Not on file  . Number of children: Not  on file  . Years of education: Not on file  . Highest education level: Not on file  Occupational History  . Not on file  Social Needs  . Financial resource strain: Not on file  . Food insecurity:    Worry: Not on file    Inability: Not on file  . Transportation needs:    Medical: Not on file    Non-medical: Not on file  Tobacco Use  . Smoking status: Current Every Day Smoker    Types: Cigarettes  . Smokeless tobacco: Never Used  Substance and Sexual Activity  . Alcohol use: No  . Drug use: No  . Sexual activity: Yes    Comment: married  Lifestyle  . Physical activity:    Days per week: Not on file    Minutes per session: Not on file  . Stress: Not on file  Relationships  . Social connections:    Talks on phone: Not on file    Gets together: Not on file    Attends religious service: Not on file    Active member of club or organization: Not on file    Attends meetings of clubs or organizations: Not on file    Relationship status: Not on file  . Intimate partner violence:    Fear of current or ex partner: Not on  file    Emotionally abused: Not on file    Physically abused: Not on file    Forced sexual activity: Not on file  Other Topics Concern  . Not on file  Social History Narrative  . Not on file   Family History  Problem Relation Age of Onset  . Cancer Father        prostate      Review of Systems  All other systems reviewed and are negative.      Objective:   Physical Exam  Constitutional: He is oriented to person, place, and time. He appears well-developed and well-nourished. No distress.  HENT:  Head: Normocephalic and atraumatic.  Right Ear: External ear normal.  Left Ear: External ear normal.  Nose: Nose normal.  Mouth/Throat: Oropharynx is clear and moist. No oropharyngeal exudate.  Eyes: Pupils are equal, round, and reactive to light. Conjunctivae and EOM are normal. Right eye exhibits no discharge. Left eye exhibits no discharge. No scleral  icterus.  Neck: Normal range of motion. Neck supple. No JVD present. No tracheal deviation present. No thyromegaly present.  Cardiovascular: Normal rate, regular rhythm, normal heart sounds and intact distal pulses. Exam reveals no gallop and no friction rub.  No murmur heard. Pulmonary/Chest: Effort normal. No stridor. No respiratory distress. He has wheezes. He has no rales. He exhibits no tenderness.  Abdominal: Soft. Bowel sounds are normal. He exhibits no distension and no mass. There is no tenderness. There is no rebound and no guarding.  Musculoskeletal: Normal range of motion. He exhibits no edema or tenderness.  Lymphadenopathy:    He has no cervical adenopathy.  Neurological: He is alert and oriented to person, place, and time. He has normal reflexes. No cranial nerve deficit. He exhibits normal muscle tone. Coordination normal.  Skin: Skin is warm. No rash noted. He is not diaphoretic. No erythema. No pallor.  Psychiatric: His affect is angry. He is agitated. Thought content is paranoid. He expresses inappropriate judgment. He is noncommunicative.  Vitals reviewed.  large mass on the right side of the face approximate the size of a tennis ball. Patient also has bilateral cataracts but he refuses any treatment for these        Assessment & Plan:  Nocturia - Plan: CBC with Differential/Platelet, COMPLETE METABOLIC PANEL WITH GFR, PSA  Benign essential HTN - Plan: CBC with Differential/Platelet, COMPLETE METABOLIC PANEL WITH GFR, Lipid panel  Pure hypercholesterolemia  Undifferentiated schizophrenia (HCC)  Prostate cancer screening Patient is extremely agitated today.  He is very mad and threatens me while I try to examine him.  He states that he hates doctors and he never wants to see a doctor.  He obviously refuses a colonoscopy.  He refuses any vaccinations.  I have recommended checking a PSA to screen for prostate cancer given his increased urinary frequency, urgency, and  hesitancy.  I suspect this is due to BPH however I cannot exclude prostate cancer.  He refuses a digital rectal exam.  I discussed with his wife referring the patient to a psychiatrist for treatment of his schizophrenia.  However the patient refuses to go to a psychiatrist and the patient's wife declines my offer.  She states that is not worth the stress that it would cause to try to get him to go.  I will monitor his renal function as well as his liver function test.  I will monitor his blood sugar and his cholesterol.  I will check a PSA.  His blood pressure  is well controlled 130/68.  He refuses any treatment for the large mass on the right side of his face which I suspect is an extremely large sebaceous cyst.  If PSA is only mildly elevated, we could try Flomax however as soon as I suggest this the patient states "hell no".

## 2018-05-11 ENCOUNTER — Other Ambulatory Visit: Payer: Self-pay | Admitting: Family Medicine

## 2018-12-01 ENCOUNTER — Other Ambulatory Visit: Payer: Self-pay | Admitting: Family Medicine

## 2019-05-10 ENCOUNTER — Other Ambulatory Visit: Payer: Self-pay | Admitting: Family Medicine

## 2020-06-03 ENCOUNTER — Other Ambulatory Visit: Payer: Self-pay | Admitting: Family Medicine

## 2020-07-02 ENCOUNTER — Other Ambulatory Visit: Payer: Self-pay

## 2020-07-02 MED ORDER — ATORVASTATIN CALCIUM 80 MG PO TABS: 80.0000 mg | ORAL_TABLET | Freq: Every day | ORAL | 3 refills | Status: DC

## 2020-07-02 MED ORDER — LISINOPRIL-HYDROCHLOROTHIAZIDE 20-12.5 MG PO TABS: 1.0000 | ORAL_TABLET | Freq: Every day | ORAL | 3 refills | Status: DC

## 2021-05-27 ENCOUNTER — Other Ambulatory Visit: Payer: Self-pay | Admitting: Family Medicine

## 2021-05-28 ENCOUNTER — Other Ambulatory Visit: Payer: Self-pay | Admitting: Family Medicine

## 2022-04-30 ENCOUNTER — Other Ambulatory Visit: Payer: Self-pay | Admitting: Family Medicine

## 2022-04-30 NOTE — Telephone Encounter (Signed)
Requested medication (s) are due for refill today: Yes  Requested medication (s) are on the active medication list: Yes  Last refill:  05/28/21  Future visit scheduled: No  Notes to clinic:  See request.    Requested Prescriptions  Pending Prescriptions Disp Refills   lisinopril-hydrochlorothiazide (ZESTORETIC) 20-12.5 MG tablet [Pharmacy Med Name: Lisinopril-hydroCHLOROthiazide 20-12.5 MG Oral Tablet] 90 tablet 3    Sig: TAKE 1 TABLET BY MOUTH  DAILY     Cardiovascular:  ACEI + Diuretic Combos Failed - 04/30/2022  7:15 AM      Failed - Na in normal range and within 180 days    Sodium  Date Value Ref Range Status  03/02/2018 138 135 - 146 mmol/L Final  10/08/2012 142 137 - 147 mmol/L Final         Failed - K in normal range and within 180 days    Potassium  Date Value Ref Range Status  03/02/2018 3.8 3.5 - 5.3 mmol/L Final         Failed - Cr in normal range and within 180 days    Creat  Date Value Ref Range Status  03/02/2018 1.23 (H) 0.70 - 1.18 mg/dL Final    Comment:    For patients >8 years of age, the reference limit for Creatinine is approximately 13% higher for people identified as African-American. .          Failed - eGFR is 30 or above and within 180 days    GFR, Est African American  Date Value Ref Range Status  03/02/2018 66 > OR = 60 mL/min/1.49m Final   GFR, Est Non African American  Date Value Ref Range Status  03/02/2018 57 (L) > OR = 60 mL/min/1.765mFinal         Failed - Last BP in normal range    BP Readings from Last 1 Encounters:  03/02/18 130/68         Failed - Valid encounter within last 6 months    Recent Outpatient Visits           4 years ago Nocturia   [place] Mcgee[facility]   5 years ago Benign essential HTN   [place] Mcgee[facility]   6 years ago Benign essential HTN   [place] [facility] Mcge[place]   7 years ago HLD (hyperlipidemia)   [place]ge[place]   8 years ago Paranoid schizophrenia   [place]              Passed - Patient is not pregnant       atorvastatin (LIPITOR) 80 MG tablet [Pharmacy Med Name: Atorvastatin Calcium 80 MG Oral Tablet] 90 tablet 3    Sig: TAKE 1 TABLET BY MOUTH ONCE DAILY     Cardiovascular:  Antilipid - Statins Failed - 04/30/2022  7:15 AM      Failed - Valid encounter within last 12 months    Recent Outpatient Visits           4 years ago Nocturia   [facility] FrizzleMD   5 years ago Benign essential HTN   [place] [facility]   6 years ago Benign essential HTN   [place]   7 years ago HLD (hyperlipidemia)   [place] Pickard, Cammie Mcgee, MD   8 years ago Paranoid schizophrenia   [place] Dennard Schaumann, Cammie Mcgee, MD              Failed - Lipid Panel in normal range within the last 12 months    Cholesterol  Date Value Ref Range Status  03/02/2018 120 <200 mg/dL Final   LDL Cholesterol (Calc)  Date Value Ref Range Status  03/02/2018 73 mg/dL (calc) Final    Comment:    Reference range: <100 . Desirable range <100 mg/dL for primary prevention;   <70 mg/dL for patients with CHD or diabetic patients  with > or = 2 CHD risk factors. Marland Kitchen LDL-C is now calculated using the Martin-Hopkins  calculation, which is a validated novel method providing  better accuracy than the Friedewald equation in the  estimation of LDL-C.  Cresenciano Genre et al. Annamaria Helling. 1583;094(07): 2061-2068  (http://education.[facility].com/faq/FAQ164)    HDL  Date Value Ref Range Status  03/02/2018 33 (L) >40 mg/dL Final   Triglycerides  Date Value Ref Range Status  03/02/2018 56 <150 mg/dL Final         Passed - Patient is not pregnant

## 2022-06-25 ENCOUNTER — Telehealth: Payer: Self-pay

## 2022-06-25 NOTE — Telephone Encounter (Signed)
Received eFax from pharmacy to request refill of or new script for  atorvastatin (LIPITOR) 80 MG tablet [[id number]]    Order Details Dose, Route, Frequency: As Directed  Dispense Quantity: 90 tablet Refills: 3   Note to Pharmacy: Requesting 1 year supply       Sig: TAKE 1 TABLET BY MOUTH  DAILY       Start Date: 05/28/21 End Date: --  Written Date: 05/28/21 Expiration Date: 05/28/22  lisinopril-hydrochlorothiazide (ZESTORETIC) 20-12.5 MG tablet [[id number]]    Order Details Dose: 1 tablet Route: Oral Frequency: Daily  Dispense Quantity: 90 tablet Refills: 3   Note to Pharmacy: Requesting 1 year supply       Sig: TAKE 1 TABLET BY MOUTH  DAILY       Start Date: 05/28/21 End Date: --  Written Date: 05/28/21 Expiration Date: 05/28/22    Pharmacy: [place], [place].  LOV: 0702/2019  Please advise pharmacist.

## 2022-07-07 ENCOUNTER — Other Ambulatory Visit: Payer: Self-pay

## 2022-07-07 DIAGNOSIS — I10 Essential (primary) hypertension: Secondary | ICD-10-CM

## 2022-07-07 DIAGNOSIS — E782 Mixed hyperlipidemia: Secondary | ICD-10-CM

## 2022-07-07 MED ORDER — LISINOPRIL-HYDROCHLOROTHIAZIDE 20-12.5 MG PO TABS: 1.0000 | ORAL_TABLET | Freq: Every day | ORAL | 0 refills | Status: DC

## 2022-07-07 MED ORDER — ATORVASTATIN CALCIUM 80 MG PO TABS: 80.0000 mg | ORAL_TABLET | Freq: Every day | ORAL | 0 refills | Status: DC

## 2022-07-10 ENCOUNTER — Telehealth: Payer: Self-pay

## 2022-07-10 DIAGNOSIS — I10 Essential (primary) hypertension: Secondary | ICD-10-CM

## 2022-07-10 DIAGNOSIS — E782 Mixed hyperlipidemia: Secondary | ICD-10-CM

## 2022-07-10 MED ORDER — ATORVASTATIN CALCIUM 80 MG PO TABS: 80.0000 mg | ORAL_TABLET | Freq: Every day | ORAL | 0 refills | Status: DC

## 2022-07-10 MED ORDER — LISINOPRIL-HYDROCHLOROTHIAZIDE 20-12.5 MG PO TABS: 1.0000 | ORAL_TABLET | Freq: Every day | ORAL | 0 refills | Status: DC

## 2022-07-10 NOTE — Telephone Encounter (Signed)
  Prescription Request  07/10/2022  Is this a "Controlled Substance" medicine? No  LOV: 06/25/2022   What is the name of the medication or equipment? atorvastatin (LIPITOR) 80 MG tablet [[id number]]   Have you contacted your pharmacy to request a refill? Yes   Which pharmacy would you like this sent to?  [facility]) [place], [place] - [address] [facility] [address] [place] [address] [place] [address] Phone: [phone] Fax: [phone]   Patient notified that their request is being sent to the clinical staff for review and that they should receive a response within 2 business days.   Please advise at [phone]   Prescription Request  07/10/2022  Is this a "Controlled Substance" medicine? No  LOV: 06/25/2022   What is the name of the medication or equipment? lisinopril-hydrochlorothiazide (ZESTORETIC) 20-12.5 MG   Have you contacted your pharmacy to request a refill? Yes   Which pharmacy would you like this sent to?  [facility]) [place], [place] - [address] [facility] [address] [place] [address] [place]  [address] Phone: [phone] Fax: [phone]   Patient notified that their request is being sent to the clinical staff for review and that they should receive a response within 2 business days.   Please advise at [phone]

## 2022-07-10 NOTE — Addendum Note (Signed)
Addended by: Arta Silence on: 07/10/2022 04:47 PM   Modules accepted: Orders

## 2022-08-07 ENCOUNTER — Ambulatory Visit (INDEPENDENT_AMBULATORY_CARE_PROVIDER_SITE_OTHER): Payer: Medicare Other | Admitting: Family Medicine

## 2022-08-07 VITALS — BP 120/72 | HR 66 | Ht 71.0 in | Wt 121.0 lb

## 2022-08-07 DIAGNOSIS — I10 Essential (primary) hypertension: Secondary | ICD-10-CM | POA: Diagnosis not present

## 2022-08-07 DIAGNOSIS — E7800 Pure hypercholesterolemia, unspecified: Secondary | ICD-10-CM

## 2022-08-07 NOTE — Progress Notes (Signed)
Subjective:    Patient ID: William Madden, male    DOB: [birth year], 81 y.o.   MRN: [id number]  Patient has not been seen in almost 3 years.  He has a history of schizophrenia and refuses flu shots, pneumonia vaccines, and any preventative care.  He is here today for follow-up of hypertension and hyperlipidemia because his "wife made him come".  He is very belligerent and refuses any of my suggestions.  Today on examination he has bilateral carotid bruits as well as a systolic ejection murmur heard best over the aortic valve.  He adamantly refuses having any chest pain or shortness of breath or lightheadedness. Past Medical History:  Diagnosis Date   Hyperlipidemia    Hypertension    Schizophrenia (HCC)    Swelling, mass, or lump on face    No past surgical history on file. Current Outpatient Medications on File Prior to Visit  Medication Sig Dispense Refill   atorvastatin (LIPITOR) 80 MG tablet Take 1 tablet (80 mg total) by mouth daily. 90 tablet 0   lisinopril-hydrochlorothiazide (ZESTORETIC) 20-12.5 MG tablet Take 1 tablet by mouth daily. 90 tablet 0   No current facility-administered medications on file prior to visit.   Allergies  Allergen Reactions   Asa [Aspirin]    Social History   Socioeconomic History   Marital status: Married    Spouse name: Not on file   Number of children: Not on file   Years of education: Not on file   Highest education level: Not on file  Occupational History   Not on file  Tobacco Use   Smoking status: Every Day    Types: Cigarettes   Smokeless tobacco: Never  Substance and Sexual Activity   Alcohol use: No   Drug use: No   Sexual activity: Yes    Comment: married  Other Topics Concern   Not on file  Social History Narrative   Not on file   Social Determinants of Health   Financial Resource Strain: Not on file  Food Insecurity: Not on file  Transportation Needs: Not on file  Physical Activity: Not on file  Stress: Not on file   Social Connections: Not on file  Intimate Partner Violence: Not on file   Family History  Problem Relation Age of Onset   Cancer Father        prostate      Review of Systems  All other systems reviewed and are negative.      Objective:   Physical Exam Vitals reviewed.  Constitutional:      General: He is not in acute distress.    Appearance: He is well-developed. He is not diaphoretic.  HENT:     Head: Normocephalic and atraumatic.     Right Ear: External ear normal.     Left Ear: External ear normal.     Nose: Nose normal.     Mouth/Throat:     Pharynx: No oropharyngeal exudate.  Eyes:     General: No scleral icterus.       Right eye: No discharge.        Left eye: No discharge.     Conjunctiva/sclera: Conjunctivae normal.     Pupils: Pupils are equal, round, and reactive to light.  Neck:     Thyroid: No thyromegaly.     Vascular: Carotid bruit present. No JVD.     Trachea: No tracheal deviation.  Cardiovascular:     Rate and Rhythm: Normal rate and regular rhythm.  Heart sounds: Murmur heard.     No friction rub. No gallop.  Pulmonary:     Effort: Pulmonary effort is normal. No respiratory distress.     Breath sounds: No stridor. Wheezing present. No rales.  Chest:     Chest wall: No tenderness.  Abdominal:     General: Bowel sounds are normal. There is no distension.     Palpations: Abdomen is soft. There is no mass.     Tenderness: There is no abdominal tenderness. There is no guarding or rebound.  Musculoskeletal:        General: No tenderness. Normal range of motion.     Cervical back: Normal range of motion and neck supple.  Lymphadenopathy:     Cervical: No cervical adenopathy.  Skin:    General: Skin is warm.     Coloration: Skin is not pale.     Findings: No erythema or rash.  Neurological:     Mental Status: He is alert and oriented to person, place, and time.     Cranial Nerves: No cranial nerve deficit.     Motor: No abnormal muscle  tone.     Coordination: Coordination normal.     Deep Tendon Reflexes: Reflexes are normal and symmetric.  Psychiatric:        Mood and Affect: Affect is angry.        Speech: He is noncommunicative.        Behavior: Behavior is agitated.        Thought Content: Thought content is paranoid.        Judgment: Judgment is inappropriate.    large mass on the right side of the face approximate the size of a softball. Patient also has bilateral cataracts but he refuses any treatment for these        Assessment & Plan:  Pure hypercholesterolemia - Plan: CBC with Differential/Platelet, COMPLETE METABOLIC PANEL WITH GFR, Lipid panel  Primary hypertension Patient refuses a flu shot as well as a pneumonia vaccine.  He declines discussing any vaccinations or preventative care.  I will check a CBC a CMP and a lipid panel.  Recommended carotid Dopplers as well as an echocardiogram which he refuses.  His wife states that she will discuss this with him and maybe be able to change his mind.

## 2022-08-08 LAB — COMPLETE METABOLIC PANEL WITH GFR
AG Ratio: 1.2 (calc) (ref 1.0–2.5)
ALT: 9 U/L (ref 9–46)
AST: 15 U/L (ref 10–35)
Albumin: 3.2 g/dL — ABNORMAL LOW (ref 3.6–5.1)
Alkaline phosphatase (APISO): 63 U/L (ref 35–144)
BUN/Creatinine Ratio: 13 (calc) (ref 6–22)
BUN: 18 mg/dL (ref 7–25)
CO2: 25 mmol/L (ref 20–32)
Calcium: 8.4 mg/dL — ABNORMAL LOW (ref 8.6–10.3)
Chloride: 105 mmol/L (ref 98–110)
Creat: 1.35 mg/dL — ABNORMAL HIGH (ref 0.70–1.22)
Globulin: 2.6 g/dL (calc) (ref 1.9–3.7)
Glucose, Bld: 89 mg/dL (ref 65–99)
Potassium: 3.5 mmol/L (ref 3.5–5.3)
Sodium: 138 mmol/L (ref 135–146)
Total Bilirubin: 0.6 mg/dL (ref 0.2–1.2)
Total Protein: 5.8 g/dL — ABNORMAL LOW (ref 6.1–8.1)
eGFR: 53 mL/min/{1.73_m2} — ABNORMAL LOW (ref >=60)

## 2022-08-08 LAB — CBC WITH DIFFERENTIAL/PLATELET
Absolute Monocytes: 380 cells/uL (ref 200–950)
Basophils Absolute: 22 cells/uL (ref 0–200)
Basophils Relative: 0.4 %
Eosinophils Absolute: 99 cells/uL (ref 15–500)
Eosinophils Relative: 1.8 %
HCT: 32.7 % — ABNORMAL LOW (ref 38.5–50.0)
Hemoglobin: 10.8 g/dL — ABNORMAL LOW (ref 13.2–17.1)
Lymphs Abs: 1755 cells/uL (ref 850–3900)
MCH: 33.8 pg — ABNORMAL HIGH (ref 27.0–33.0)
MCHC: 33 g/dL (ref 32.0–36.0)
MCV: 102.2 fL — ABNORMAL HIGH (ref 80.0–100.0)
MPV: 11.6 fL (ref 7.5–12.5)
Monocytes Relative: 6.9 %
Neutro Abs: 3245 cells/uL (ref 1500–7800)
Neutrophils Relative %: 59 %
Platelets: 96 10*3/uL — ABNORMAL LOW (ref 140–400)
RBC: 3.2 10*6/uL — ABNORMAL LOW (ref 4.20–5.80)
RDW: 12.2 % (ref 11.0–15.0)
Total Lymphocyte: 31.9 %
WBC: 5.5 10*3/uL (ref 3.8–10.8)

## 2022-08-08 LAB — LIPID PANEL
Cholesterol: 129 mg/dL (ref <200)
HDL: 35 mg/dL — ABNORMAL LOW (ref >=40)
LDL Cholesterol (Calc): 80 mg/dL (calc)
Non-HDL Cholesterol (Calc): 94 mg/dL (calc) (ref <130)
Total CHOL/HDL Ratio: 3.7 (calc) (ref <5.0)
Triglycerides: 67 mg/dL (ref <150)

## 2022-09-22 ENCOUNTER — Telehealth: Payer: Self-pay | Admitting: Family Medicine

## 2022-09-22 NOTE — Telephone Encounter (Signed)
Phone busy unable to leave a message for patient to call back and schedule Medicare Annual Wellness Visit (AWV) in office.   If not able to come in office, please offer to do virtually or by telephone.   No history of  AWV: Per [employer] eligible for AWVI as of  09/01/2009  Please schedule at anytime with [facility]  If any questions, please contact me at [phone]  Thank you,  [facility]  Ambulatory Clinical Support for [place] [facility]. We Are. One [facility] ??[phone] or ??[phone]

## 2022-10-14 ENCOUNTER — Telehealth: Payer: Self-pay | Admitting: Family Medicine

## 2022-10-14 NOTE — Telephone Encounter (Signed)
No answer unable to leave a  message for patient to call back and schedule Medicare Annual Wellness Visit (AWV) in office.   If not able to come in office, please offer to do virtually or by telephone.   No history of  AWV: Per [employer] eligible for AWVI as of  09/01/2009   Please schedule at anytime with [facility]  If any questions, please contact me at [phone]  Thank you,  [facility]  Ambulatory Clinical Support for [place] [facility]. We Are[facility] ??[phone] or ??[phone]

## 2022-11-02 ENCOUNTER — Other Ambulatory Visit: Payer: Self-pay | Admitting: Family Medicine

## 2022-11-02 DIAGNOSIS — I10 Essential (primary) hypertension: Secondary | ICD-10-CM

## 2022-11-02 DIAGNOSIS — E782 Mixed hyperlipidemia: Secondary | ICD-10-CM

## 2022-11-20 ENCOUNTER — Telehealth: Payer: Self-pay | Admitting: Family Medicine

## 2022-11-20 NOTE — Telephone Encounter (Signed)
Called patient to schedule Medicare Annual Wellness Visit (AWV). Left message for patient to call back and schedule Medicare Annual Wellness Visit (AWV).  Last date of AWV: never done per EPIC  Please schedule an appointment at any time with Amber.

## 2022-12-08 ENCOUNTER — Telehealth: Payer: Self-pay | Admitting: Family Medicine

## 2022-12-08 NOTE — Telephone Encounter (Signed)
Called patient to schedule Medicare Annual Wellness Visit (AWV). Left message for patient to call back and schedule Medicare Annual Wellness Visit (AWV).  Last date of AWV: due 09/01/2009 awvi per [employer]  Please schedule an appointment at any time with Courtney, N[facility] .  If any questions, please contact me at [phone].  Thank you,  Stephanie,  [facility] Clinical Support [facility] AWV Program Direct Dial ??[phone]    

## 2022-12-11 ENCOUNTER — Ambulatory Visit (INDEPENDENT_AMBULATORY_CARE_PROVIDER_SITE_OTHER): Payer: Medicare Other

## 2022-12-11 VITALS — Ht 71.0 in | Wt 121.0 lb

## 2022-12-11 DIAGNOSIS — Z0000 Encounter for general adult medical examination without abnormal findings: Secondary | ICD-10-CM | POA: Diagnosis not present

## 2022-12-11 NOTE — Patient Instructions (Signed)
William Madden , Thank you for taking time to come for your Medicare Wellness Visit. I appreciate your ongoing commitment to your health goals. Please review the following plan we discussed and let me know if I can assist you in the future.   These are the goals we discussed:  Goals      Have 3 meals a day        This is a list of the screening recommended for you and due dates:  Health Maintenance  Topic Date Due   COVID-19 Vaccine (1) Never done   Zoster (Shingles) Vaccine (1 of 2) Never done   Pneumonia Vaccine (1 of 2 - PCV) 08/08/2023*   Flu Shot  04/02/2023   Medicare Annual Wellness Visit  12/11/2023   HPV Vaccine  Aged Out   DTaP/Tdap/Td vaccine  Discontinued  *Topic was postponed. The date shown is not the original due date.    Advanced directives: Advance directive discussed with you today. I have provided a copy for you to complete at home and have notarized. Once this is complete please bring a copy in to our office so we can scan it into your chart.   Conditions/risks identified: Aim for 30 minutes of exercise or brisk walking, 6-8 glasses of water, and 5 servings of fruits and vegetables each day.   Next appointment: Follow up in one year for your annual wellness visit.   Please call back to schedule your yearly visit with Dr. Tanya Nones for December.   Preventive Care 81 Years and Older, Male  Preventive care refers to lifestyle choices and visits with your health care provider that can promote health and wellness. What does preventive care include? A yearly physical exam. This is also called an annual well check. Dental exams once or twice a year. Routine eye exams. Ask your health care provider how often you should have your eyes checked. Personal lifestyle choices, including: Daily care of your teeth and gums. Regular physical activity. Eating a healthy diet. Avoiding tobacco and drug use. Limiting alcohol use. Practicing safe sex. Taking low doses of aspirin  every day. Taking vitamin and mineral supplements as recommended by your health care provider. What happens during an annual well check? The services and screenings done by your health care provider during your annual well check will depend on your age, overall health, lifestyle risk factors, and family history of disease. Counseling  Your health care provider may ask you questions about your: Alcohol use. Tobacco use. Drug use. Emotional well-being. Home and relationship well-being. Sexual activity. Eating habits. History of falls. Memory and ability to understand (cognition). Work and work Astronomer. Screening  You may have the following tests or measurements: Height, weight, and BMI. Blood pressure. Lipid and cholesterol levels. These may be checked every 5 years, or more frequently if you are over 36 years old. Skin check. Lung cancer screening. You may have this screening every year starting at age 27 if you have a 30-pack-year history of smoking and currently smoke or have quit within the past 15 years. Fecal occult blood test (FOBT) of the stool. You may have this test every year starting at age 30. Flexible sigmoidoscopy or colonoscopy. You may have a sigmoidoscopy every 5 years or a colonoscopy every 10 years starting at age 70. Prostate cancer screening. Recommendations will vary depending on your family history and other risks. Hepatitis C blood test. Hepatitis B blood test. Sexually transmitted disease (STD) testing. Diabetes screening. This is done by checking  your blood sugar (glucose) after you have not eaten for a while (fasting). You may have this done every 1-3 years. Abdominal aortic aneurysm (AAA) screening. You may need this if you are a current or former smoker. Osteoporosis. You may be screened starting at age 17 if you are at high risk. Talk with your health care provider about your test results, treatment options, and if necessary, the need for more  tests. Vaccines  Your health care provider may recommend certain vaccines, such as: Influenza vaccine. This is recommended every year. Tetanus, diphtheria, and acellular pertussis (Tdap, Td) vaccine. You may need a Td booster every 10 years. Zoster vaccine. You may need this after age 40. Pneumococcal 13-valent conjugate (PCV13) vaccine. One dose is recommended after age 4. Pneumococcal polysaccharide (PPSV23) vaccine. One dose is recommended after age 67. Talk to your health care provider about which screenings and vaccines you need and how often you need them. This information is not intended to replace advice given to you by your health care provider. Make sure you discuss any questions you have with your health care provider. Document Released: 09/14/2015 Document Revised: 05/07/2016 Document Reviewed: 06/19/2015 Elsevier Interactive Patient Education  2017 [employer] Prevention in the Home Falls can cause injuries. They can happen to people of all ages. There are many things you can do to make your home safe and to help prevent falls. What can I do on the outside of my home? Regularly fix the edges of walkways and driveways and fix any cracks. Remove anything that might make you trip as you walk through a door, such as a raised step or threshold. Trim any bushes or trees on the path to your home. Use bright outdoor lighting. Clear any walking paths of anything that might make someone trip, such as rocks or tools. Regularly check to see if handrails are loose or broken. Make sure that both sides of any steps have handrails. Any raised decks and porches should have guardrails on the edges. Have any leaves, snow, or ice cleared regularly. Use sand or salt on walking paths during winter. Clean up any spills in your garage right away. This includes oil or grease spills. What can I do in the bathroom? Use night lights. Install grab bars by the toilet and in the tub and shower.  Do not use towel bars as grab bars. Use non-skid mats or decals in the tub or shower. If you need to sit down in the shower, use a plastic, non-slip stool. Keep the floor dry. Clean up any water that spills on the floor as soon as it happens. Remove soap buildup in the tub or shower regularly. Attach bath mats securely with double-sided non-slip rug tape. Do not have throw rugs and other things on the floor that can make you trip. What can I do in the bedroom? Use night lights. Make sure that you have a light by your bed that is easy to reach. Do not use any sheets or blankets that are too big for your bed. They should not hang down onto the floor. Have a firm chair that has side arms. You can use this for support while you get dressed. Do not have throw rugs and other things on the floor that can make you trip. What can I do in the kitchen? Clean up any spills right away. Avoid walking on wet floors. Keep items that you use a lot in easy-to-reach places. If you need to reach something  above you, use a strong step stool that has a grab bar. Keep electrical cords out of the way. Do not use floor polish or wax that makes floors slippery. If you must use wax, use non-skid floor wax. Do not have throw rugs and other things on the floor that can make you trip. What can I do with my stairs? Do not leave any items on the stairs. Make sure that there are handrails on both sides of the stairs and use them. Fix handrails that are broken or loose. Make sure that handrails are as long as the stairways. Check any carpeting to make sure that it is firmly attached to the stairs. Fix any carpet that is loose or worn. Avoid having throw rugs at the top or bottom of the stairs. If you do have throw rugs, attach them to the floor with carpet tape. Make sure that you have a light switch at the top of the stairs and the bottom of the stairs. If you do not have them, ask someone to add them for you. What else  can I do to help prevent falls? Wear shoes that: Do not have high heels. Have rubber bottoms. Are comfortable and fit you well. Are closed at the toe. Do not wear sandals. If you use a stepladder: Make sure that it is fully opened. Do not climb a closed stepladder. Make sure that both sides of the stepladder are locked into place. Ask someone to hold it for you, if possible. Clearly mark and make sure that you can see: Any grab bars or handrails. First and last steps. Where the edge of each step is. Use tools that help you move around (mobility aids) if they are needed. These include: Canes. Walkers. Scooters. Crutches. Turn on the lights when you go into a dark area. Replace any light bulbs as soon as they burn out. Set up your furniture so you have a clear path. Avoid moving your furniture around. If any of your floors are uneven, fix them. If there are any pets around you, be aware of where they are. Review your medicines with your doctor. Some medicines can make you feel dizzy. This can increase your chance of falling. Ask your doctor what other things that you can do to help prevent falls. This information is not intended to replace advice given to you by your health care provider. Make sure you discuss any questions you have with your health care provider. Document Released: 06/14/2009 Document Revised: 01/24/2016 Document Reviewed: 09/22/2014 Elsevier Interactive Patient Education  2017 [employer].

## 2022-12-11 NOTE — Progress Notes (Signed)
Subjective:   William Madden is a 82 y.o. male who presents for Medicare Annual/Subsequent preventive examination.  I connected with  Farris Has on 12/11/22 by a audio enabled telemedicine application and verified that I am speaking with the correct person using two identifiers.  Patient Location: Home  Provider Location: Office/Clinic  I discussed the limitations of evaluation and management by telemedicine. The patient expressed understanding and agreed to proceed.  Review of Systems     Cardiac Risk Factors include: advanced age (>29men, >40 women);hypertension;male gender;dyslipidemia     Objective:    Today's Vitals   12/11/22 1425  Weight: 121 lb (54.9 kg)  Height: 5\' 11"  (1.803 m)   Body mass index is 16.88 kg/m.     12/11/2022    2:33 PM 02/08/2016   11:06 AM  Advanced Directives  Does Patient Have a Medical Advance Directive? No No  Would patient like information on creating a medical advance directive? Yes (MAU/Ambulatory/Procedural Areas - Information given) No - patient declined information    Current Medications (verified) Outpatient Encounter Medications as of 12/11/2022  Medication Sig   atorvastatin (LIPITOR) 80 MG tablet TAKE 1 TABLET BY MOUTH DAILY   lisinopril-hydrochlorothiazide (ZESTORETIC) 20-12.5 MG tablet TAKE 1 TABLET BY MOUTH DAILY   No facility-administered encounter medications on file as of 12/11/2022.    Allergies (verified) Asa [aspirin]   History: Past Medical History:  Diagnosis Date   Hyperlipidemia    Hypertension    Schizophrenia    Swelling, mass, or lump on face    No past surgical history on file. Family History  Problem Relation Age of Onset   Cancer Father        prostate   Social History   Socioeconomic History   Marital status: Married    Spouse name: Not on file   Number of children: Not on file   Years of education: Not on file   Highest education level: Not on file  Occupational History   Not on file   Tobacco Use   Smoking status: Every Day    Packs/day: 1    Types: Cigarettes   Smokeless tobacco: Never  Substance and Sexual Activity   Alcohol use: No   Drug use: No   Sexual activity: Yes    Comment: married  Other Topics Concern   Not on file  Social History Narrative   Not on file   Social Determinants of Health   Financial Resource Strain: Low Risk  (12/11/2022)   Overall Financial Resource Strain (CARDIA)    Difficulty of Paying Living Expenses: Not hard at all  Food Insecurity: No Food Insecurity (12/11/2022)   Hunger Vital Sign    Worried About Running Out of Food in the Last Year: Never true    Ran Out of Food in the Last Year: Never true  Transportation Needs: No Transportation Needs (12/11/2022)   PRAPARE - Administrator, Civil Service (Medical): No    Lack of Transportation (Non-Medical): No  Physical Activity: Inactive (12/11/2022)   Exercise Vital Sign    Days of Exercise per Week: 0 days    Minutes of Exercise per Session: 0 min  Stress: No Stress Concern Present (12/11/2022)   [employer] of Occupational Health - Occupational Stress Questionnaire    Feeling of Stress : Not at all  Social Connections: Moderately Integrated (12/11/2022)   Social Connection and Isolation Panel [NHANES]    Frequency of Communication with Friends and Family: More  than three times a week    Frequency of Social Gatherings with Friends and Family: Once a week    Attends Religious Services: 1 to 4 times per year    Active Member of [employer]ubs or Organizations: No    Attends Engineer, structuralClub or Organization Meetings: Never    Marital Status: Married    Tobacco Counseling Ready to quit: Not Answered Counseling given: Not Answered   Clinical Intake:  Pre-visit preparation completed: Yes  Pain : No/denies pain  Diabetes: No  How often do you need to have someone help you when you read instructions, pamphlets, or other written materials from your doctor or pharmacy?: 1 -  Never  Diabetic?No   Interpreter Needed?: No  Information entered by :: Kandis Fantasiaourtney Heer Justiss LPN   Activities of Daily Living    12/11/2022    2:33 PM  In your present state of health, do you have any difficulty performing the following activities:  Hearing? 0  Vision? 0  Difficulty concentrating or making decisions? 0  Walking or climbing stairs? 0  Dressing or bathing? 0  Doing errands, shopping? 0  Preparing Food and eating ? N  Using the Toilet? N  In the past six months, have you accidently leaked urine? N  Do you have problems with loss of bowel control? N  Managing your Medications? N  Managing your Finances? N  Housekeeping or managing your Housekeeping? N    Patient Care Team: Donita BrooksPickard, Warren T, MD as PCP - General (Family Medicine)  Indicate any recent Medical Services you may have received from other than [facility] providers in the past year (date may be approximate).     Assessment:   This is a routine wellness examination for [place].  Hearing/Vision screen Hearing Screening - Comments:: Hard of hearing  Vision Screening - Comments:: Declines screening   Dietary issues and exercise activities discussed: Current Exercise Habits: The patient does not participate in regular exercise at present   Goals Addressed             This Visit's Progress    Have 3 meals a day         Depression Screen    12/11/2022    2:32 PM 08/07/2022    3:08 PM 03/02/2018   11:49 AM 02/08/2016   11:06 AM  PHQ 2/9 Scores  PHQ - 2 Score 0 0 2 0  PHQ- 9 Score   2     Fall Risk    12/11/2022    2:31 PM 08/07/2022    3:08 PM 03/02/2018   11:49 AM 02/08/2016   11:06 AM  Fall Risk   Falls in the past year? 0 0 No No  Number falls in past yr: 0 0    Injury with Fall? 0 0    Risk for fall due to : No Fall Risks No Fall Risks    Follow up Falls prevention discussed;Education provided;Falls evaluation completed Falls prevention discussed      FALL RISK PREVENTION PERTAINING TO THE  HOME:  Any stairs in or around the home? No  If so, are there any without handrails? No  Home free of loose throw rugs in walkways, pet beds, electrical cords, etc? Yes  Adequate lighting in your home to reduce risk of falls? Yes   ASSISTIVE DEVICES UTILIZED TO PREVENT FALLS:  Life alert? No  Use of a cane, walker or w/c? No  Grab bars in the bathroom? Yes  Shower chair or bench in shower?  No  Elevated toilet seat or a handicapped toilet? Yes   TIMED UP AND GO:  Was the test performed? No . Telephonic visit   Cognitive Function:        12/11/2022    2:33 PM  6CIT Screen  What Year? 0 points  What month? 0 points  What time? 0 points  Count back from 20 0 points  Months in reverse 4 points  Repeat phrase 4 points  Total Score 8 points    Immunizations  There is no immunization history on file for this patient.  TDAP status: Due, Education has been provided regarding the importance of this vaccine. Advised may receive this vaccine at local pharmacy or Health Dept. Aware to provide a copy of the vaccination record if obtained from local pharmacy or Health Dept. Verbalized acceptance and understanding.  Flu Vaccine status: Declined, Education has been provided regarding the importance of this vaccine but patient still declined. Advised may receive this vaccine at local pharmacy or Health Dept. Aware to provide a copy of the vaccination record if obtained from local pharmacy or Health Dept. Verbalized acceptance and understanding.  Pneumococcal vaccine status: Declined,  Education has been provided regarding the importance of this vaccine but patient still declined. Advised may receive this vaccine at local pharmacy or Health Dept. Aware to provide a copy of the vaccination record if obtained from local pharmacy or Health Dept. Verbalized acceptance and understanding.   Covid-19 vaccine status: Declined, Education has been provided regarding the importance of this vaccine but  patient still declined. Advised may receive this vaccine at local pharmacy or Health Dept.or vaccine clinic. Aware to provide a copy of the vaccination record if obtained from local pharmacy or Health Dept. Verbalized acceptance and understanding.  Qualifies for Shingles Vaccine? Yes   Zostavax completed No   Shingrix Completed?: No.    Education has been provided regarding the importance of this vaccine. Patient has been advised to call insurance company to determine out of pocket expense if they have not yet received this vaccine. Advised may also receive vaccine at local pharmacy or Health Dept. Verbalized acceptance and understanding.  Screening Tests Health Maintenance  Topic Date Due   COVID-19 Vaccine (1) Never done   Zoster Vaccines- Shingrix (1 of 2) Never done   Pneumonia Vaccine 83+ Years old (1 of 2 - PCV) 08/08/2023 (Originally 07/08/1947)   INFLUENZA VACCINE  04/02/2023   Medicare Annual Wellness (AWV)  12/11/2023   HPV VACCINES  Aged Out   DTaP/Tdap/Td  Discontinued    Health Maintenance  Health Maintenance Due  Topic Date Due   COVID-19 Vaccine (1) Never done   Zoster Vaccines- Shingrix (1 of 2) Never done    Colorectal cancer screening: No longer required.   Lung Cancer Screening: (Low Dose CT Chest recommended if Age 28-80 years, 30 pack-year currently smoking OR have quit w/in 15years.) does not qualify.   Lung Cancer Screening Referral: n/a  Additional Screening:  Hepatitis C Screening: does not qualify  Vision Screening: Recommended annual ophthalmology exams for early detection of glaucoma and other disorders of the eye. Is the patient up to date with their annual eye exam?  No  Who is the provider or what is the name of the office in which the patient attends annual eye exams? none If pt is not established with a provider, would they like to be referred to a provider to establish care? No .   Dental Screening: Recommended annual dental exams  for proper  oral hygiene  Community Resource Referral / Chronic Care Management: CRR required this visit?  No   CCM required this visit?  No      Plan:     I have personally reviewed and noted the following in the patient's chart:   Medical and social history Use of alcohol, tobacco or illicit drugs  Current medications and supplements including opioid prescriptions. Patient is not currently taking opioid prescriptions. Functional ability and status Nutritional status Physical activity Advanced directives List of other physicians Hospitalizations, surgeries, and ER visits in previous 12 months Vitals Screenings to include cognitive, depression, and falls Referrals and appointments  In addition, I have reviewed and discussed with patient certain preventive protocols, quality metrics, and best practice recommendations. A written personalized care plan for preventive services as well as general preventive health recommendations were provided to patient.     Durwin Nora, [place]   0/45/4098   Due to this being a virtual visit, the after visit summary with patients personalized plan was offered to patient via mail or my-chart.  per request, patient was mailed a copy of AVS.  Nurse Notes: No concerns

## 2023-06-26 ENCOUNTER — Other Ambulatory Visit: Payer: Self-pay | Admitting: Family Medicine

## 2023-06-26 DIAGNOSIS — I10 Essential (primary) hypertension: Secondary | ICD-10-CM

## 2023-06-26 DIAGNOSIS — E782 Mixed hyperlipidemia: Secondary | ICD-10-CM

## 2023-06-29 NOTE — Telephone Encounter (Signed)
Request too soon for refill.  Requested Prescriptions  Pending Prescriptions Disp Refills   lisinopril-hydrochlorothiazide (ZESTORETIC) 20-12.5 MG tablet [Pharmacy Med Name: Lisinopril-hydroCHLOROthiazide 20-12.5 MG Oral Tablet] 100 tablet 2    Sig: TAKE 1 TABLET BY MOUTH DAILY     Cardiovascular:  ACEI + Diuretic Combos Failed - 06/26/2023 10:43 PM      Failed - Na in normal range and within 180 days    Sodium  Date Value Ref Range Status  08/07/2022 138 135 - 146 mmol/L Final  10/08/2012 142 137 - 147 mmol/L Final         Failed - K in normal range and within 180 days    Potassium  Date Value Ref Range Status  08/07/2022 3.5 3.5 - 5.3 mmol/L Final         Failed - Cr in normal range and within 180 days    Creat  Date Value Ref Range Status  08/07/2022 1.35 (H) 0.70 - 1.22 mg/dL Final         Failed - eGFR is 30 or above and within 180 days    GFR, Est African American  Date Value Ref Range Status  03/02/2018 66 > OR = 60 mL/min/1.76m2 Final   GFR, Est Non African American  Date Value Ref Range Status  03/02/2018 57 (L) > OR = 60 mL/min/1.3m2 Final   eGFR  Date Value Ref Range Status  08/07/2022 53 (L) > OR = 60 mL/min/1.75m2 Final         Failed - Valid encounter within last 6 months    Recent Outpatient Visits           5 years ago Nocturia   [facility] Donita Brooks, MD   6 years ago Benign essential HTN   [facility] Donita Brooks, MD   7 years ago Benign essential HTN   [facility] Tanya Nones, Priscille Heidelberg, MD   8 years ago HLD (hyperlipidemia)   [facility] Tanya Nones, Priscille Heidelberg, MD   9 years ago Paranoid schizophrenia   [facility] Pickard, Priscille Heidelberg, MD              Passed - Patient is not pregnant      Passed - Last BP in normal range    BP Readings from Last 1 Encounters:  08/07/22 120/72          atorvastatin (LIPITOR) 80 MG tablet [Pharmacy Med Name:  Atorvastatin Calcium 80 MG Oral Tablet] 100 tablet 2    Sig: TAKE 1 TABLET BY MOUTH DAILY     Cardiovascular:  Antilipid - Statins Failed - 06/26/2023 10:43 PM      Failed - Valid encounter within last 12 months    Recent Outpatient Visits           5 years ago Nocturia   [facility] Donita Brooks, MD   6 years ago Benign essential HTN   [facility] Donita Brooks, MD   7 years ago Benign essential HTN   [facility] Tanya Nones, Priscille Heidelberg, MD   8 years ago HLD (hyperlipidemia)   [facility] Pickard, Priscille Heidelberg, MD   9 years ago Paranoid schizophrenia   [facility] Pickard, Priscille Heidelberg, MD              Failed - Lipid Panel in normal range within the last 12  months    Cholesterol  Date Value Ref Range Status  08/07/2022 129 <200 mg/dL Final   LDL Cholesterol (Calc)  Date Value Ref Range Status  08/07/2022 80 mg/dL (calc) Final    Comment:    Reference range: <100 . Desirable range <100 mg/dL for primary prevention;   <70 mg/dL for patients with CHD or diabetic patients  with > or = 2 CHD risk factors. Marland Kitchen LDL-C is now calculated using the Martin-Hopkins  calculation, which is a validated novel method providing  better accuracy than the Friedewald equation in the  estimation of LDL-C.  Horald Pollen et al. Lenox Ahr. 9562;130(86): 2061-2068  (http://education.[facility].com/faq/FAQ164)    HDL  Date Value Ref Range Status  08/07/2022 35 (L) > OR = 40 mg/dL Final   Triglycerides  Date Value Ref Range Status  08/07/2022 67 <150 mg/dL Final         Passed - Patient is not pregnant

## 2023-09-10 ENCOUNTER — Other Ambulatory Visit: Payer: Self-pay | Admitting: Family Medicine

## 2023-09-10 DIAGNOSIS — I10 Essential (primary) hypertension: Secondary | ICD-10-CM

## 2023-09-11 ENCOUNTER — Other Ambulatory Visit: Payer: Self-pay | Admitting: Family Medicine

## 2023-09-11 DIAGNOSIS — E782 Mixed hyperlipidemia: Secondary | ICD-10-CM

## 2023-09-14 NOTE — Telephone Encounter (Signed)
 Rx was sent to pharmacy on 11/03/22 #90/3 RF  Requested Prescriptions  Pending Prescriptions Disp Refills   lisinopril -hydrochlorothiazide  (ZESTORETIC ) 20-12.5 MG tablet [Pharmacy Med Name: Lisinopril -hydroCHLOROthiazide  20-12.5 MG Oral Tablet] 80 tablet 3    Sig: TAKE 1 TABLET BY MOUTH DAILY     Cardiovascular:  ACEI + Diuretic Combos Failed - 09/14/2023 12:50 PM      Failed - Na in normal range and within 180 days    Sodium  Date Value Ref Range Status  08/07/2022 138 135 - 146 mmol/L Final  10/08/2012 142 137 - 147 mmol/L Final         Failed - K in normal range and within 180 days    Potassium  Date Value Ref Range Status  08/07/2022 3.5 3.5 - 5.3 mmol/L Final         Failed - Cr in normal range and within 180 days    Creat  Date Value Ref Range Status  08/07/2022 1.35 (H) 0.70 - 1.22 mg/dL Final         Failed - eGFR is 30 or above and within 180 days    GFR, Est African American  Date Value Ref Range Status  03/02/2018 66 > OR = 60 mL/min/1.98m2 Final   GFR, Est Non African American  Date Value Ref Range Status  03/02/2018 57 (L) > OR = 60 mL/min/1.72m2 Final   eGFR  Date Value Ref Range Status  08/07/2022 53 (L) > OR = 60 mL/min/1.75m2 Final         Failed - Valid encounter within last 6 months    Recent Outpatient Visits           5 years ago Nocturia   [facility] Duanne Butler DASEN, MD   6 years ago Benign essential HTN   [facility] Duanne Butler DASEN, MD   7 years ago Benign essential HTN   [facility] Duanne Butler DASEN, MD   8 years ago HLD (hyperlipidemia)   [facility] Duanne, Butler DASEN, MD   9 years ago Paranoid schizophrenia   [facility] Pickard, Butler DASEN, MD              Passed - Patient is not pregnant      Passed - Last BP in normal range    BP Readings from Last 1 Encounters:  08/07/22 120/72

## 2023-09-14 NOTE — Telephone Encounter (Signed)
 Requested medications are due for refill today.  yes  Requested medications are on the active medications list.  yes  Last refill. 11/03/2022 #90 3 rf  Future visit scheduled.   no  Notes to clinic.  Labs are expired. Pt is overdue for an OV.    Requested Prescriptions  Pending Prescriptions Disp Refills   atorvastatin  (LIPITOR) 80 MG tablet [Pharmacy Med Name: Atorvastatin  Calcium  80 MG Oral Tablet] 80 tablet 3    Sig: TAKE 1 TABLET BY MOUTH DAILY     Cardiovascular:  Antilipid - Statins Failed - 09/14/2023 12:49 PM      Failed - Valid encounter within last 12 months    Recent Outpatient Visits           5 years ago Nocturia   [facility] Duanne Butler DASEN, MD   6 years ago Benign essential HTN   [facility] Duanne, Butler DASEN, MD   7 years ago Benign essential HTN   [facility] Duanne, Butler DASEN, MD   8 years ago HLD (hyperlipidemia)   [facility] Duanne, Butler DASEN, MD   9 years ago Paranoid schizophrenia   [facility] Duanne, Butler DASEN, MD              Failed - Lipid Panel in normal range within the last 12 months    Cholesterol  Date Value Ref Range Status  08/07/2022 129 <200 mg/dL Final   LDL Cholesterol (Calc)  Date Value Ref Range Status  08/07/2022 80 mg/dL (calc) Final    Comment:    Reference range: <100 . Desirable range <100 mg/dL for primary prevention;   <70 mg/dL for patients with CHD or diabetic patients  with > or = 2 CHD risk factors. SABRA LDL-C is now calculated using the Martin-Hopkins  calculation, which is a validated novel method providing  better accuracy than the Friedewald equation in the  estimation of LDL-C.  Gladis APPLETHWAITE et al. SANDREA. 7986;689(80): 2061-2068  (http://education.[facility].com/faq/FAQ164)    HDL  Date Value Ref Range Status  08/07/2022 35 (L) > OR = 40 mg/dL Final   Triglycerides  Date Value Ref Range Status  08/07/2022 67  <150 mg/dL Final         Passed - Patient is not pregnant

## 2023-12-14 ENCOUNTER — Other Ambulatory Visit: Payer: Self-pay | Admitting: Family Medicine

## 2023-12-14 DIAGNOSIS — I10 Essential (primary) hypertension: Secondary | ICD-10-CM

## 2023-12-14 DIAGNOSIS — E782 Mixed hyperlipidemia: Secondary | ICD-10-CM

## 2023-12-14 NOTE — Telephone Encounter (Signed)
 Copied from CRM [id number]. Topic: Clinical - Medication Refill >> Dec 14, 2023  4:10 PM Ivette P wrote: Most Recent Primary Care Visit:  Provider: Cathye Coca  Department: [facility]  Visit Type: MEDICARE AWV, SEQUENTIAL  Date: 12/11/2022  Medication: lisinopril-hydrochlorothiazide (ZESTORETIC) 20-12.5 MG tablet atorvastatin (LIPITOR) 80 MG tablet   Has the patient contacted their pharmacy? Yes (Agent: If no, request that the patient contact the pharmacy for the refill. If patient does not wish to contact the pharmacy document the reason why and proceed with request.) (Agent: If yes, when and what did the pharmacy advise?)  Is this the correct pharmacy for this prescription? Yes If no, delete pharmacy and type the correct one.  This is the patient's preferred pharmacy:  [facility] - [place], [place] - [address] [place] [address] Phone: [phone] Fax: [phone]  [facility] - [place], [place] - [address] [place] [address] Phone: [phone] Fax: [phone]   Has the prescription been filled recently? No, 11/03/2022  Is the patient out of the medication? No, 2 weeks left   Has the patient been seen for an appointment in the last year OR does the patient have an upcoming appointment? Yes  Can we respond through MyChart? No  Agent: Please be advised that Rx refills may take up to 3 business days. We ask that you follow-up with your pharmacy.

## 2023-12-14 NOTE — Telephone Encounter (Unsigned)
 Copied from CRM [id number]. Topic: Clinical - Medication Refill >> Dec 14, 2023  4:10 PM Ivette P wrote: Most Recent Primary Care Visit:  Provider: Cathye Coca  Department: [facility]  Visit Type: MEDICARE AWV, SEQUENTIAL  Date: 12/11/2022  Medication: lisinopril-hydrochlorothiazide (ZESTORETIC) 20-12.5 MG tablet atorvastatin (LIPITOR) 80 MG tablet   Has the patient contacted their pharmacy? Yes (Agent: If no, request that the patient contact the pharmacy for the refill. If patient does not wish to contact the pharmacy document the reason why and proceed with request.) (Agent: If yes, when and what did the pharmacy advise?)  Is this the correct pharmacy for this prescription? Yes If no, delete pharmacy and type the correct one.  This is the patient's preferred pharmacy:  [facility] - [place], [place] - [address] [facility] [address] [place] [address] Phone: [phone] Fax: [phone]  [facility] - [place], [place] - [address] [place] [address] Phone: [phone] Fax: [phone]   Has the prescription been filled recently? No, 11/03/2022  Is the patient out of the medication? No, 2 weeks left   Has the patient been seen for an appointment in the last year OR does the patient have an upcoming appointment? Yes  Can we respond through MyChart? No  Agent: Please be advised that Rx refills may take up to 3 business days. We ask that you follow-up with your pharmacy.

## 2023-12-15 MED ORDER — ATORVASTATIN CALCIUM 80 MG PO TABS: 80.0000 mg | ORAL_TABLET | Freq: Every day | ORAL | 3 refills | Status: DC

## 2023-12-15 MED ORDER — LISINOPRIL-HYDROCHLOROTHIAZIDE 20-12.5 MG PO TABS: 1.0000 | ORAL_TABLET | Freq: Every day | ORAL | 3 refills | Status: DC

## 2023-12-15 NOTE — Telephone Encounter (Signed)
 Requested medications are due for refill today.  yes  Requested medications are on the active medications list.  yes  Last refill. 11/03/2022 #90 3 rf  Future visit scheduled.   yes  Notes to clinic.  Labs are expired.    Requested Prescriptions  Pending Prescriptions Disp Refills   atorvastatin (LIPITOR) 80 MG tablet 90 tablet 3    Sig: Take 1 tablet (80 mg total) by mouth daily.     Cardiovascular:  Antilipid - Statins Failed - 12/15/2023  3:00 PM      Failed - Valid encounter within last 12 months    Recent Outpatient Visits           1 year ago Pure hypercholesterolemia   [place] [facility] Pickard, Cisco Crest, MD              Failed - Lipid Panel in normal range within the last 12 months    Cholesterol  Date Value Ref Range Status  08/07/2022 129 <200 mg/dL Final   LDL Cholesterol (Calc)  Date Value Ref Range Status  08/07/2022 80 mg/dL (calc) Final    Comment:    Reference range: <100 . Desirable range <100 mg/dL for primary prevention;   <70 mg/dL for patients with CHD or diabetic patients  with > or = 2 CHD risk factors. Aaron Aas LDL-C is now calculated using the Martin-Hopkins  calculation, which is a validated novel method providing  better accuracy than the Friedewald equation in the  estimation of LDL-C.  Melinda Sprawls et al. Erroll Heard. 1308;657(84): 2061-2068  (http://education.[facility].com/faq/FAQ164)    HDL  Date Value Ref Range Status  08/07/2022 35 (L) > OR = 40 mg/dL Final   Triglycerides  Date Value Ref Range Status  08/07/2022 67 <150 mg/dL Final         Passed - Patient is not pregnant       lisinopril-hydrochlorothiazide (ZESTORETIC) 20-12.5 MG tablet 90 tablet 3    Sig: Take 1 tablet by mouth daily.     Cardiovascular:  ACEI + Diuretic Combos Failed - 12/15/2023  3:00 PM      Failed - Na in normal range and within 180 days    Sodium  Date Value Ref Range Status  08/07/2022 138 135 - 146 mmol/L Final  10/08/2012  142 137 - 147 mmol/L Final         Failed - K in normal range and within 180 days    Potassium  Date Value Ref Range Status  08/07/2022 3.5 3.5 - 5.3 mmol/L Final         Failed - Cr in normal range and within 180 days    Creat  Date Value Ref Range Status  08/07/2022 1.35 (H) 0.70 - 1.22 mg/dL Final         Failed - eGFR is 30 or above and within 180 days    GFR, Est African American  Date Value Ref Range Status  03/02/2018 66 > OR = 60 mL/min/1.68m2 Final   GFR, Est Non African American  Date Value Ref Range Status  03/02/2018 57 (L) > OR = 60 mL/min/1.58m2 Final   eGFR  Date Value Ref Range Status  08/07/2022 53 (L) > OR = 60 mL/min/1.52m2 Final         Failed - Valid encounter within last 6 months    Recent Outpatient Visits           1 year ago Pure hypercholesterolemia   [place] [facility] Pickard, Cisco Crest, MD              Passed - Patient is not pregnant      Passed - Last BP in normal range    BP Readings from Last 1 Encounters:  08/07/22 120/72

## 2023-12-15 NOTE — Telephone Encounter (Signed)
 Requested Prescriptions  Pending Prescriptions Disp Refills   atorvastatin (LIPITOR) 80 MG tablet 90 tablet 3    Sig: Take 1 tablet (80 mg total) by mouth daily.     Cardiovascular:  Antilipid - Statins Failed - 12/15/2023  3:03 PM      Failed - Valid encounter within last 12 months    Recent Outpatient Visits           1 year ago Pure hypercholesterolemia   [place] [facility] Pickard, Cisco Crest, MD              Failed - Lipid Panel in normal range within the last 12 months    Cholesterol  Date Value Ref Range Status  08/07/2022 129 <200 mg/dL Final   LDL Cholesterol (Calc)  Date Value Ref Range Status  08/07/2022 80 mg/dL (calc) Final    Comment:    Reference range: <100 . Desirable range <100 mg/dL for primary prevention;   <70 mg/dL for patients with CHD or diabetic patients  with > or = 2 CHD risk factors. Aaron Aas LDL-C is now calculated using the Martin-Hopkins  calculation, which is a validated novel method providing  better accuracy than the Friedewald equation in the  estimation of LDL-C.  Melinda Sprawls et al. Erroll Heard. 1610;960(45): 2061-2068  (http://education.[facility].com/faq/FAQ164)    HDL  Date Value Ref Range Status  08/07/2022 35 (L) > OR = 40 mg/dL Final   Triglycerides  Date Value Ref Range Status  08/07/2022 67 <150 mg/dL Final         Passed - Patient is not pregnant       lisinopril-hydrochlorothiazide (ZESTORETIC) 20-12.5 MG tablet 90 tablet 3    Sig: Take 1 tablet by mouth daily.     Cardiovascular:  ACEI + Diuretic Combos Failed - 12/15/2023  3:03 PM      Failed - Na in normal range and within 180 days    Sodium  Date Value Ref Range Status  08/07/2022 138 135 - 146 mmol/L Final  10/08/2012 142 137 - 147 mmol/L Final         Failed - K in normal range and within 180 days    Potassium  Date Value Ref Range Status  08/07/2022 3.5 3.5 - 5.3 mmol/L Final         Failed - Cr in normal range and within 180 days     Creat  Date Value Ref Range Status  08/07/2022 1.35 (H) 0.70 - 1.22 mg/dL Final         Failed - eGFR is 30 or above and within 180 days    GFR, Est African American  Date Value Ref Range Status  03/02/2018 66 > OR = 60 mL/min/1.28m2 Final   GFR, Est Non African American  Date Value Ref Range Status  03/02/2018 57 (L) > OR = 60 mL/min/1.13m2 Final   eGFR  Date Value Ref Range Status  08/07/2022 53 (L) > OR = 60 mL/min/1.62m2 Final         Failed - Valid encounter within last 6 months    Recent Outpatient Visits           1 year ago Pure hypercholesterolemia   [place] [facility] Austine Lefort, MD              Passed - Patient is not pregnant      Passed - Last BP in normal range    BP Readings from  Last 1 Encounters:  08/07/22 120/72

## 2023-12-24 ENCOUNTER — Encounter: Payer: Self-pay | Admitting: Family Medicine

## 2023-12-24 ENCOUNTER — Ambulatory Visit (INDEPENDENT_AMBULATORY_CARE_PROVIDER_SITE_OTHER): Admitting: Family Medicine

## 2023-12-24 VITALS — BP 142/68 | HR 53 | Temp 98.3°F | Ht 71.0 in | Wt 126.0 lb

## 2023-12-24 DIAGNOSIS — R350 Frequency of micturition: Secondary | ICD-10-CM

## 2023-12-24 DIAGNOSIS — I10 Essential (primary) hypertension: Secondary | ICD-10-CM

## 2023-12-24 DIAGNOSIS — D649 Anemia, unspecified: Secondary | ICD-10-CM | POA: Diagnosis not present

## 2023-12-24 NOTE — Progress Notes (Signed)
 Subjective:    Patient ID: William Madden, male    DOB: 1940/12/27, 83 y.o.   MRN: [id number]  Patient with has a history of schizophrenia and refuses flu shots, pneumonia vaccines, and any preventative care.  He is here today for follow-up of hypertension and hyperlipidemia.  Patient is always very belligerent.  He gets very upset if I ask him questions.  He adamantly denies any problems.  Specifically he denies any chest pain or shortness of breath.  Last year he had carotid bruits but he refused any workup.  Wife reports increased urinary frequency.  He also is having urinary incontinence/urge incontinence.  He also has frequent nocturia.  He denies any problem.  He denies any dysuria.  He denies any hematuria.  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  However, patient refused to follow-up with the previous surgeon. Past Medical History:  Diagnosis Date   Hyperlipidemia    Hypertension    Schizophrenia (HCC)    Swelling, mass, or lump on face    No past surgical history on file. Current Outpatient Medications on File Prior to Visit  Medication Sig Dispense Refill   atorvastatin  (LIPITOR) 80 MG tablet Take 1 tablet (80 mg total) by mouth daily. 90 tablet 3   lisinopril -hydrochlorothiazide  (ZESTORETIC ) 20-12.5 MG tablet Take 1 tablet by mouth daily. 90 tablet 3   No current facility-administered medications on file prior to visit.   Allergies  Allergen Reactions   Asa [Aspirin]    Social History   Socioeconomic History   Marital status: Married    Spouse name: Not on file   Number of children: Not on file   Years of education: Not on file   Highest education level: Not on file  Occupational History   Not on file  Tobacco Use   Smoking status: Every Day    Current packs/day: 1.00    Types: Cigarettes   Smokeless tobacco: Never  Substance and Sexual Activity   Alcohol use: No   Drug use: No   Sexual activity: Yes    Comment: married  Other Topics  Concern   Not on file  Social History Narrative   Not on file   Social Drivers of Health   Financial Resource Strain: Low Risk  (12/11/2022)   Overall Financial Resource Strain (CARDIA)    Difficulty of Paying Living Expenses: Not hard at all  Food Insecurity: No Food Insecurity (12/11/2022)   Hunger Vital Sign    Worried About Running Out of Food in the Last Year: Never true    Ran Out of Food in the Last Year: Never true  Transportation Needs: No Transportation Needs (12/11/2022)   PRAPARE - Administrator, Civil Service (Medical): No    Lack of Transportation (Non-Medical): No  Physical Activity: Inactive (12/11/2022)   Exercise Vital Sign    Days of Exercise per Week: 0 days    Minutes of Exercise per Session: 0 min  Stress: No Stress Concern Present (12/11/2022)   [employer] of Occupational Health - Occupational Stress Questionnaire    Feeling of Stress : Not at all  Social Connections: Moderately Integrated (12/11/2022)   Social Connection and Isolation Panel [NHANES]    Frequency of Communication with Friends and Family: More than three times a week    Frequency of Social Gatherings with Friends and Family: Once a week    Attends Religious Services: 1 to 4 times per year  Active Member of Clubs or Organizations: No    Attends Banker Meetings: Never    Marital Status: Married  Catering manager Violence: Not At Risk (12/11/2022)   Humiliation, Afraid, Rape, and Kick questionnaire    Fear of Current or Ex-Partner: No    Emotionally Abused: No    Physically Abused: No    Sexually Abused: No   Family History  Problem Relation Age of Onset   Cancer Father        prostate      Review of Systems  All other systems reviewed and are negative.      Objective:   Physical Exam Vitals reviewed.  Constitutional:      General: He is not in acute distress.    Appearance: He is well-developed. He is not diaphoretic.  HENT:     Head:  Normocephalic and atraumatic.     Right Ear: External ear normal.     Left Ear: External ear normal.     Nose: Nose normal.     Mouth/Throat:     Pharynx: No oropharyngeal exudate.  Eyes:     General: No scleral icterus.       Right eye: No discharge.        Left eye: No discharge.     Conjunctiva/sclera: Conjunctivae normal.     Pupils: Pupils are equal, round, and reactive to light.  Neck:     Thyroid: No thyromegaly.     Vascular: Carotid bruit present. No JVD.     Trachea: No tracheal deviation.  Cardiovascular:     Rate and Rhythm: Normal rate and regular rhythm.     Heart sounds: Murmur heard.     No friction rub. No gallop.  Pulmonary:     Effort: Pulmonary effort is normal. No respiratory distress.     Breath sounds: No stridor. No wheezing or rales.  Chest:     Chest wall: No tenderness.  Abdominal:     General: Bowel sounds are normal. There is no distension.     Palpations: Abdomen is soft. There is no mass.     Tenderness: There is no abdominal tenderness. There is no guarding or rebound.  Musculoskeletal:        General: No tenderness. Normal range of motion.     Cervical back: Normal range of motion and neck supple.  Lymphadenopathy:     Cervical: No cervical adenopathy.  Skin:    General: Skin is warm.     Coloration: Skin is not pale.     Findings: No erythema or rash.  Neurological:     Mental Status: He is alert and oriented to person, place, and time.     Cranial Nerves: No cranial nerve deficit.     Motor: No abnormal muscle tone.     Coordination: Coordination normal.     Deep Tendon Reflexes: Reflexes are normal and symmetric.  Psychiatric:        Mood and Affect: Affect is angry.        Speech: He is noncommunicative.        Behavior: Behavior is agitated.        Thought Content: Thought content is paranoid.        Judgment: Judgment is inappropriate.    large mass on the right side of the face approximate the size of a softball. Patient also  has bilateral cataracts but he refuses any treatment for these        Assessment &  Plan:  Benign essential HTN - Plan: CBC with Differential/Platelet, COMPLETE METABOLIC PANEL WITHOUT GFR, Lipid panel  Urinary frequency - Plan: PSA Patient refuses any preventative care.  Blood pressure today is acceptable.  He refuses additional medication.  I will check a CBC a CMP and lipid panel and due to his urinary frequency and PSA.  If his PSA is not extremely high, I will try the patient on Flomax for BPH with lower urinary tract symptoms.  If it is extremely high, I would recommend urology consultation possible prostate.  Patient declines digital rectal exam today.

## 2023-12-27 LAB — COMPLETE METABOLIC PANEL WITHOUT GFR
AG Ratio: 1.5 (calc) (ref 1.0–2.5)
ALT: 9 U/L (ref 9–46)
AST: 13 U/L (ref 10–35)
Albumin: 3.1 g/dL — ABNORMAL LOW (ref 3.6–5.1)
Alkaline phosphatase (APISO): 60 U/L (ref 35–144)
BUN/Creatinine Ratio: 14 (calc) (ref 6–22)
BUN: 21 mg/dL (ref 7–25)
CO2: 22 mmol/L (ref 20–32)
Calcium: 7.9 mg/dL — ABNORMAL LOW (ref 8.6–10.3)
Chloride: 108 mmol/L (ref 98–110)
Creat: 1.45 mg/dL — ABNORMAL HIGH (ref 0.70–1.22)
Globulin: 2.1 g/dL (ref 1.9–3.7)
Glucose, Bld: 90 mg/dL (ref 65–99)
Potassium: 4.2 mmol/L (ref 3.5–5.3)
Sodium: 139 mmol/L (ref 135–146)
Total Bilirubin: 0.4 mg/dL (ref 0.2–1.2)
Total Protein: 5.2 g/dL — ABNORMAL LOW (ref 6.1–8.1)

## 2023-12-27 LAB — CBC WITH DIFFERENTIAL/PLATELET
Absolute Lymphocytes: 1835 {cells}/uL (ref 850–3900)
Absolute Monocytes: 466 {cells}/uL (ref 200–950)
Basophils Absolute: 18 {cells}/uL (ref 0–200)
Basophils Relative: 0.3 %
Eosinophils Absolute: 159 {cells}/uL (ref 15–500)
Eosinophils Relative: 2.7 %
HCT: 31.1 % — ABNORMAL LOW (ref 38.5–50.0)
Hemoglobin: 9.9 g/dL — ABNORMAL LOW (ref 13.2–17.1)
MCH: 33.6 pg — ABNORMAL HIGH (ref 27.0–33.0)
MCHC: 31.8 g/dL — ABNORMAL LOW (ref 32.0–36.0)
MCV: 105.4 fL — ABNORMAL HIGH (ref 80.0–100.0)
MPV: 11.7 fL (ref 7.5–12.5)
Monocytes Relative: 7.9 %
Neutro Abs: 3422 {cells}/uL (ref 1500–7800)
Neutrophils Relative %: 58 %
Platelets: 103 10*3/uL — ABNORMAL LOW (ref 140–400)
RBC: 2.95 10*6/uL — ABNORMAL LOW (ref 4.20–5.80)
RDW: 12.5 % (ref 11.0–15.0)
Total Lymphocyte: 31.1 %
WBC: 5.9 10*3/uL (ref 3.8–10.8)

## 2023-12-27 LAB — IRON,TIBC AND FERRITIN PANEL
%SAT: 33 % (ref 20–48)
Ferritin: 34 ng/mL (ref 24–380)
Iron: 62 ug/dL (ref 50–180)
TIBC: 190 ug/dL — ABNORMAL LOW (ref 250–425)

## 2023-12-27 LAB — VITAMIN B12: Vitamin B-12: 310 pg/mL (ref 200–1100)

## 2023-12-27 LAB — TEST AUTHORIZATION

## 2023-12-27 LAB — LIPID PANEL
Cholesterol: 99 mg/dL (ref <200)
HDL: 31 mg/dL — ABNORMAL LOW (ref >=40)
LDL Cholesterol (Calc): 54 mg/dL
Non-HDL Cholesterol (Calc): 68 mg/dL (ref <130)
Total CHOL/HDL Ratio: 3.2 (calc) (ref <5.0)
Triglycerides: 48 mg/dL (ref <150)

## 2023-12-27 LAB — PSA: PSA: 3.91 ng/mL (ref <=4.00)

## 2023-12-29 ENCOUNTER — Other Ambulatory Visit: Payer: Self-pay

## 2023-12-29 ENCOUNTER — Other Ambulatory Visit: Payer: Self-pay | Admitting: Family Medicine

## 2023-12-29 DIAGNOSIS — D509 Iron deficiency anemia, unspecified: Secondary | ICD-10-CM

## 2023-12-29 MED ORDER — TAMSULOSIN HCL 0.4 MG PO CAPS: 0.4000 mg | ORAL_CAPSULE | Freq: Every day | ORAL | 3 refills | Status: DC

## 2023-12-29 MED ORDER — IRON (FERROUS SULFATE) 325 (65 FE) MG PO TABS: 325.0000 mg | ORAL_TABLET | Freq: Every day | ORAL | 1 refills | Status: AC

## 2024-01-28 ENCOUNTER — Telehealth: Payer: Self-pay | Admitting: Family Medicine

## 2024-01-28 NOTE — Telephone Encounter (Signed)
 Patient's wife is requesting a call back to follow up on previous conversation with provider to start the patient on iron ; stated she wasn't sure if it was supposed to be a prescription or something they should purchase over the counter. Please advise at [phone].

## 2024-02-05 ENCOUNTER — Ambulatory Visit

## 2024-02-05 VITALS — Ht 71.0 in | Wt 126.0 lb

## 2024-02-05 DIAGNOSIS — Z0000 Encounter for general adult medical examination without abnormal findings: Secondary | ICD-10-CM | POA: Diagnosis not present

## 2024-02-05 NOTE — Progress Notes (Signed)
 Subjective:   William Madden is a 83 y.o. male who presents for Medicare Annual/Subsequent preventive examination.  Visit Complete: Virtual I connected with  William Madden on 02/05/24 by a audio enabled telemedicine application and verified that I am speaking with the correct person using two identifiers.  Patient Location: Home  Provider Location: Home Office  I discussed the limitations of evaluation and management by telemedicine. The patient expressed understanding and agreed to proceed.  Vital Signs: Because this visit was a virtual/telehealth visit, some criteria may be missing or patient reported. Any vitals not documented were not able to be obtained and vitals that have been documented are patient reported.   Cardiac Risk Factors include: advanced age (>67men, >59 women);hypertension;male gender;dyslipidemia     Objective:    Today's Vitals   02/05/24 1408  Weight: 126 lb (57.2 kg)  Height: 5\' 11"  (1.803 m)   Body mass index is 17.57 kg/m.     02/05/2024    2:24 PM 12/11/2022    2:33 PM 02/08/2016   11:06 AM  Advanced Directives  Does Patient Have a Medical Advance Directive? No No No  Would patient like information on creating a medical advance directive? No - Patient declined Yes (MAU/Ambulatory/Procedural Areas - Information given) No - patient declined information    Current Medications (verified) Outpatient Encounter Medications as of 02/05/2024  Medication Sig   atorvastatin  (LIPITOR) 80 MG tablet Take 1 tablet (80 mg total) by mouth daily.   lisinopril -hydrochlorothiazide  (ZESTORETIC ) 20-12.5 MG tablet Take 1 tablet by mouth daily.   tamsulosin  (FLOMAX ) 0.4 MG CAPS capsule Take 1 capsule (0.4 mg total) by mouth daily.   Iron , Ferrous Sulfate , 325 (65 Fe) MG TABS Take 325 mg by mouth daily. (Patient not taking: Reported on 02/05/2024)   No facility-administered encounter medications on file as of 02/05/2024.    Allergies (verified) Asa [aspirin]    History: Past Medical History:  Diagnosis Date   Hyperlipidemia    Hypertension    Schizophrenia (HCC)    Swelling, mass, or lump on face    History reviewed. No pertinent surgical history. Family History  Problem Relation Age of Onset   Cancer Father        prostate   Social History   Socioeconomic History   Marital status: Married    Spouse name: Not on file   Number of children: Not on file   Years of education: Not on file   Highest education level: Not on file  Occupational History   Not on file  Tobacco Use   Smoking status: Every Day    Current packs/day: 1.00    Types: Cigarettes   Smokeless tobacco: Never  Vaping Use   Vaping status: Never Used  Substance and Sexual Activity   Alcohol use: No   Drug use: No   Sexual activity: Yes    Comment: married  Other Topics Concern   Not on file  Social History Narrative   Not on file   Social Drivers of Health   Financial Resource Strain: Low Risk  (02/05/2024)   Overall Financial Resource Strain (CARDIA)    Difficulty of Paying Living Expenses: Not hard at all  Food Insecurity: No Food Insecurity (02/05/2024)   Hunger Vital Sign    Worried About Running Out of Food in the Last Year: Never true    Ran Out of Food in the Last Year: Never true  Transportation Needs: No Transportation Needs (02/05/2024)   PRAPARE - Transportation  Lack of Transportation (Medical): No    Lack of Transportation (Non-Medical): No  Physical Activity: Inactive (02/05/2024)   Exercise Vital Sign    Days of Exercise per Week: 0 days    Minutes of Exercise per Session: 0 min  Stress: No Stress Concern Present (02/05/2024)   [facility] [employer] - Occupational Stress Questionnaire    Feeling of Stress : Not at all  Social Connections: Moderately Integrated (02/05/2024)   Social Connection and Isolation Panel [NHANES]    Frequency of Communication with Friends and Family: More than three times a week    Frequency of  Social Gatherings with Friends and Family: Once a week    Attends Religious Services: 1 to 4 times per year    Active Member of [employer] or Organizations: No    Attends Engineer, structural: Never    Marital Status: Married    Tobacco Counseling Ready to quit: No Counseling given: No   Clinical Intake:  Pre-visit preparation completed: Yes  Pain : No/denies pain     BMI - recorded: 17.57 Nutritional Status: BMI <19  Underweight Nutritional Risks: None Diabetes: No  How often do you need to have someone help you when you read instructions, pamphlets, or other written materials from your doctor or pharmacy?:  (Pt wife helps him with anything he needs help with) What is the last grade level you completed in school?: 12th grade  Interpreter Needed?: No  Information entered by :: William Madden   Activities of Daily Living    02/05/2024    2:35 PM 02/05/2024    2:34 PM  In your present state of health, do you have any difficulty performing the following activities:  Hearing? 1 1  Comment Pt wife has to repeat things   Vision? 0 0  Difficulty concentrating or making decisions? 1 1  Comment pt wife has to help him remember things   Walking or climbing stairs? 1 1  Comment pt states he has difficulty walking to due his age   Preparing Food and eating ? Y Y  Comment pt prepares his breakfast but wife cooks everything else   Managing your Medications? Y Y  Comment pt wife helps him   Managing your Finances? Y Y  Comment pt wife helps him   Housekeeping or managing your Housekeeping? William Madden  Comment pt wife helps him     Patient Care Team: William Lefort, MD as PCP - General (Family Medicine)  Indicate any recent Medical Services you may have received from other than [facility] providers in the past year (date may be approximate).     Assessment:   This is a routine wellness examination for William Madden.  Hearing/Vision screen Hearing Screening - Comments:: Has  difficulty hearing, talks very loudly.  Vision Screening - Comments:: No vision issues. Does not see an eye doctor regularly.    Goals Addressed             This Visit's Progress    Have 3 meals a day   On track    William Madden would like to continue to stay healthy.        Depression Screen    02/05/2024    2:22 PM 12/24/2023    8:55 AM 12/11/2022    2:32 PM 08/07/2022    3:08 PM 03/02/2018   11:49 AM 02/08/2016   11:06 AM  PHQ 2/9 Scores  PHQ - 2 Score 0 0 0 0 2 0  PHQ- 9 Score     2     Fall Risk    02/05/2024    2:23 PM 12/24/2023    8:54 AM 12/11/2022    2:31 PM 08/07/2022    3:08 PM 03/02/2018   11:49 AM  Fall Risk   Falls in the past year? 0 0 0 0 No  Number falls in past yr: 0 0 0 0   Injury with Fall? 0 0 0 0   Risk for fall due to :  No Fall Risks No Fall Risks No Fall Risks   Follow up  Falls prevention discussed;Falls evaluation completed Falls prevention discussed;Education provided;Falls evaluation completed Falls prevention discussed     MEDICARE RISK AT HOME: Medicare Risk at Home Any stairs in or around the home?: No If so, are there any without handrails?: No Home free of loose throw rugs in walkways, pet beds, electrical cords, etc?: Yes Adequate lighting in your home to reduce risk of falls?: Yes Life alert?: No Use of a cane, walker or w/c?: No Grab bars in the bathroom?: Yes Shower chair or bench in shower?: Yes Elevated toilet seat or a handicapped toilet?: No  TIMED UP AND GO:  Was the test performed?  No  Telehealth visit  Cognitive Function:        02/05/2024    2:19 PM 12/11/2022    2:33 PM  6CIT Screen  What Year? 0 points 0 points  What month? 0 points 0 points  What time? 3 points 0 points  Count back from 20 4 points 0 points  Months in reverse 4 points 4 points  Repeat phrase 10 points 4 points  Total Score 21 points 8 points    Immunizations  There is no immunization history on file for this patient.  TDAP status: Due,  Education has been provided regarding the importance of this vaccine. Advised may receive this vaccine at local pharmacy or Health Dept. Aware to provide a copy of the vaccination record if obtained from local pharmacy or Health Dept. Verbalized acceptance and understanding.  Flu Vaccine status: Up to date  Pneumococcal vaccine status: Declined,  Education has been provided regarding the importance of this vaccine but patient still declined. Advised may receive this vaccine at local pharmacy or Health Dept. Aware to provide a copy of the vaccination record if obtained from local pharmacy or Health Dept. Verbalized acceptance and understanding.   Covid-19 vaccine status: Declined, Education has been provided regarding the importance of this vaccine but patient still declined. Advised may receive this vaccine at local pharmacy or Health Dept.or vaccine clinic. Aware to provide a copy of the vaccination record if obtained from local pharmacy or Health Dept. Verbalized acceptance and understanding.  Qualifies for Shingles Vaccine? Yes   Zostavax completed No   Shingrix Completed?: No.    Education has been provided regarding the importance of this vaccine. Patient has been advised to call insurance company to determine out of pocket expense if they have not yet received this vaccine. Advised may also receive vaccine at local pharmacy or Health Dept. Verbalized acceptance and understanding.  Screening Tests Health Maintenance  Topic Date Due   Pneumonia Vaccine 92+ Years old (1 of 2 - PCV) Never done   Zoster Vaccines- Shingrix (1 of 2) Never done   COVID-19 Vaccine (1 - 2024-25 season) Never done   INFLUENZA VACCINE  04/01/2024   Medicare Annual Wellness (AWV)  02/04/2025   HPV VACCINES  Aged Out  Meningococcal B Vaccine  Aged Out   DTaP/Tdap/Td  Discontinued    Health Maintenance  Health Maintenance Due  Topic Date Due   Pneumonia Vaccine 27+ Years old (1 of 2 - PCV) Never done   Zoster  Vaccines- Shingrix (1 of 2) Never done   COVID-19 Vaccine (1 - 2024-25 season) Never done    Colorectal cancer screening: No longer required.   Lung Cancer Screening: (Low Dose CT Chest recommended if Age 31-80 years, 20 pack-year currently smoking OR have quit w/in 15years.) does qualify.   Lung Cancer Screening Referral: Pt declines  Additional Screening:  Hepatitis C Screening: does not qualify;  Vision Screening: Recommended annual ophthalmology exams for early detection of glaucoma and other disorders of the eye. Is the patient up to date with their annual eye exam?  No  Who is the provider or what is the name of the office in which the patient attends annual eye exams? Does not see an eye doctor If pt is not established with a provider, would they like to be referred to a provider to establish care? No .   Dental Screening: Recommended annual dental exams for proper oral hygiene  Diabetic Foot Exam: Does not qualify  Community Resource Referral / Chronic Care Management: CRR required this visit?  No   CCM required this visit?  No     Plan:     I have personally reviewed and noted the following in the patient's chart:   Medical and social history Use of alcohol, tobacco or illicit drugs  Current medications and supplements including opioid prescriptions. Patient is not currently taking opioid prescriptions. Functional ability and status Nutritional status Physical activity Advanced directives List of other physicians Hospitalizations, surgeries, and ER visits in previous 12 months Vitals Screenings to include cognitive, depression, and falls Referrals and appointments  In addition, I have reviewed and discussed with patient certain preventive protocols, quality metrics, and best practice recommendations. A written personalized care plan for preventive services as well as general preventive health recommendations were provided to patient.     William Madden, CMA   02/05/2024   After Visit Summary: (Declined) Due to this being a telephonic visit, with patients personalized plan was offered to patient but patient Declined AVS at this time   Nurse Notes: It was a pleasure speaking with William Placeres. I am concerned about his cognitive abilities. His wife had to help him answer most questions. She also states he has a hard time remembering anything.

## 2024-08-24 ENCOUNTER — Other Ambulatory Visit: Payer: Self-pay | Admitting: Family Medicine

## 2024-08-29 ENCOUNTER — Other Ambulatory Visit: Payer: Self-pay | Admitting: Family Medicine

## 2024-08-29 DIAGNOSIS — I10 Essential (primary) hypertension: Secondary | ICD-10-CM

## 2024-08-29 DIAGNOSIS — E782 Mixed hyperlipidemia: Secondary | ICD-10-CM
